# Patient Record
Sex: Male | Born: 1990 | Hispanic: Yes | Marital: Married | Smoking: Current every day smoker
Health system: Southern US, Community
[De-identification: ages and names within clinical notes are randomized; demographics above are authoritative.]

## PROBLEM LIST (undated history)

## (undated) DIAGNOSIS — R51 Headache: Secondary | ICD-10-CM

## (undated) DIAGNOSIS — R42 Dizziness and giddiness: Secondary | ICD-10-CM

## (undated) DIAGNOSIS — Z9989 Dependence on other enabling machines and devices: Secondary | ICD-10-CM

## (undated) DIAGNOSIS — R519 Headache, unspecified: Secondary | ICD-10-CM

## (undated) DIAGNOSIS — G4733 Obstructive sleep apnea (adult) (pediatric): Secondary | ICD-10-CM

## (undated) DIAGNOSIS — K219 Gastro-esophageal reflux disease without esophagitis: Secondary | ICD-10-CM

## (undated) HISTORY — DX: Dependence on other enabling machines and devices: Z99.89

## (undated) HISTORY — DX: Headache, unspecified: R51.9

## (undated) HISTORY — PX: ORIF HUMERUS FRACTURE: SHX2126

## (undated) HISTORY — DX: Gastro-esophageal reflux disease without esophagitis: K21.9

## (undated) HISTORY — PX: GASTRIC BYPASS: SHX52

## (undated) HISTORY — DX: Headache: R51

## (undated) HISTORY — DX: Obstructive sleep apnea (adult) (pediatric): G47.33

## (undated) HISTORY — PX: FRACTURE SURGERY: SHX138

## (undated) NOTE — *Deleted (*Deleted)
[facility] EMERGENCY DEPARTMENT Provider Note   CSN: [id number] Arrival date & time: 07/27/20  1642     History Chief Complaint  Patient presents with  . Abdominal Pain    Arthur Collins is a 16 y.o. male.  HPI     Past Medical History:  Diagnosis Date  . Frequent headaches   . GERD (gastroesophageal reflux disease)   . OSA on CPAP 03/11/2015   Dr Mellody Dance clance.   . Vertigo     Patient Active Problem List   Diagnosis Date Noted  . 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 Chronic fatigue 08/13/2015  . Snoring 08/13/2015  . Complaint of debility and malaise 08/13/2015  . Sleep related headaches 08/13/2015  . 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 Atypical chest pain 06/01/2015  . Obesity 06/01/2015  . Esophageal reflux 06/01/2015  . PND (paroxysmal nocturnal dyspnea) 06/01/2015  . Intermittent palpitations 05/12/2015  . Heart murmur, systolic 05/12/2015  . Intractable migraine with aura with status migrainosus 03/18/2015  . [place] 03/11/2015  . Headache behind the eyes 03/11/2015  . Dysesthesia of multiple sites 03/11/2015  . Benign paroxysmal positional vertigo 03/11/2015  . OSA on CPAP 03/11/2015    Past Surgical History:  Procedure Laterality Date  . FRACTURE SURGERY    . GASTRIC BYPASS    . ORIF HUMERUS FRACTURE         Family History  Problem Relation Age of Onset  . Diabetes Mother   . Hypertension Mother   . Other Father        LIVER ISSUES  . Epilepsy Brother   . Healthy Maternal Grandmother   . Healthy Brother   . Healthy Brother   . Healthy Brother   . Healthy Brother     Social History   Tobacco Use  . Smoking status: Former Smoker    Packs/day: 1.00    Types: Cigarettes  . Smokeless tobacco: Never Used  Vaping Use  . Vaping Use: Every day  . Substances: Nicotine, Flavoring  Substance Use Topics  . Alcohol use: Yes    Alcohol/week: 0.0 standard drinks    Comment: rarely  . Drug use: No    Home  Medications Prior to Admission medications   Medication Sig Start Date End Date Taking? Authorizing Provider  ibuprofen (ADVIL) 800 MG tablet Take 1 tablet (800 mg total) by mouth 3 (three) times daily. 06/25/20   Wieters, Hallie C, PA-C  esomeprazole (NEXIUM) 20 MG packet Take 20 mg by mouth daily before breakfast. 06/01/15 06/25/20  Wallis Bamberg, PA-C    Allergies    Patient has no known allergies.  Review of Systems   Review of Systems  Physical Exam Updated Vital Signs BP (!) 131/94 (BP Location: Right Arm)   Pulse 74   Temp 98.5 F (36.9 C) (Oral)   SpO2 100%   Physical Exam  ED Results / Procedures / Treatments   Labs (all labs ordered are listed, but only abnormal results are displayed) Labs Reviewed  LIPASE, BLOOD - Abnormal; Notable for the following components:      Result Value   Lipase 71 (*)    All other components within normal limits  COMPREHENSIVE METABOLIC PANEL - Abnormal; Notable for the following components:   Total Protein 9.0 (*)    AST 50 (*)    ALT 122 (*)    Anion gap 16 (*)    All other components within normal limits  CBC - Abnormal; Notable for the  following components:   RBC 6.21 (*)    HCT 52.1 (*)    All other components within normal limits  URINALYSIS, ROUTINE W REFLEX MICROSCOPIC    EKG None  Radiology No results found.  Procedures Procedures (including critical care time)  Medications Ordered in ED Medications  lactated ringers bolus 1,000 mL (has no administration in time range)  ondansetron (ZOFRAN) injection 4 mg (has no administration in time range)    ED Course  I have reviewed the triage vital signs and the nursing notes.  Pertinent labs & imaging results that were available during my care of the patient were reviewed by me and considered in my medical decision making (see chart for details).    MDM Rules/Calculators/A&P                          *** Final Clinical Impression(s) / ED Diagnoses Final diagnoses:   None    Rx / DC Orders ED Discharge Orders    None

---
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
## 2010-02-13 ENCOUNTER — Emergency Department (HOSPITAL_COMMUNITY): Admission: EM | Admit: 2010-02-13 | Discharge: 2010-02-13 | Payer: Self-pay | Admitting: Emergency Medicine

## 2011-01-04 LAB — RAPID STREP SCREEN (MED CTR MEBANE ONLY): Streptococcus, Group A Screen (Direct): NEGATIVE

## 2011-07-19 ENCOUNTER — Encounter: Payer: Self-pay | Admitting: *Deleted

## 2011-07-19 ENCOUNTER — Emergency Department (HOSPITAL_COMMUNITY): Payer: Self-pay

## 2011-07-19 ENCOUNTER — Emergency Department (HOSPITAL_COMMUNITY)
Admission: EM | Admit: 2011-07-19 | Discharge: 2011-07-19 | Disposition: A | Discharge status: Home / Self Care | Payer: Self-pay | Attending: Emergency Medicine | Admitting: Emergency Medicine

## 2011-07-19 DIAGNOSIS — R599 Enlarged lymph nodes, unspecified: Secondary | ICD-10-CM | POA: Insufficient documentation

## 2011-07-19 DIAGNOSIS — F17200 Nicotine dependence, unspecified, uncomplicated: Secondary | ICD-10-CM | POA: Insufficient documentation

## 2011-07-19 DIAGNOSIS — R42 Dizziness and giddiness: Secondary | ICD-10-CM | POA: Insufficient documentation

## 2011-07-19 DIAGNOSIS — R51 Headache: Secondary | ICD-10-CM | POA: Insufficient documentation

## 2011-07-19 MED ORDER — MECLIZINE HCL 12.5 MG PO TABS
25.0000 mg | ORAL_TABLET | Freq: Once | ORAL | Status: AC
  Administered 2011-07-19: 25 mg via ORAL
  Filled 2011-07-19: qty 2

## 2011-07-19 MED ORDER — METOCLOPRAMIDE HCL 5 MG/ML IJ SOLN
10.0000 mg | Freq: Once | INTRAMUSCULAR | Status: AC
  Administered 2011-07-19: 10 mg via INTRAVENOUS
  Filled 2011-07-19: qty 2

## 2011-07-19 MED ORDER — PROMETHAZINE HCL 25 MG PO TABS: 25.0000 mg | ORAL_TABLET | Freq: Four times a day (QID) | ORAL | Status: DC | PRN

## 2011-07-19 MED ORDER — SODIUM CHLORIDE 0.9 % IV BOLUS (SEPSIS)
1000.0000 mL | Freq: Once | INTRAVENOUS | Status: AC
  Administered 2011-07-19: 1000 mL via INTRAVENOUS

## 2011-07-19 MED ORDER — MECLIZINE HCL 50 MG PO TABS: ORAL_TABLET | ORAL | Status: DC

## 2011-07-19 MED ORDER — SODIUM CHLORIDE 0.9 % IV SOLN
INTRAVENOUS | Status: DC
  Administered 2011-07-19: 1000 mL via INTRAVENOUS

## 2011-07-19 MED ORDER — DIPHENHYDRAMINE HCL 50 MG/ML IJ SOLN
25.0000 mg | Freq: Once | INTRAMUSCULAR | Status: AC
  Administered 2011-07-19: 25 mg via INTRAVENOUS
  Filled 2011-07-19: qty 1

## 2011-07-19 NOTE — ED Notes (Addendum)
Pt c/o headache to right side of head that began last pm. Pt describes headache as a stabbing pain. To back right side of head. Pt also states he is dizzy and has had blurred vision.

## 2011-07-19 NOTE — ED Provider Notes (Signed)
Scribed for Ward Givens, MD, the patient was seen in room APA12/APA12 . This chart was scribed by Ellie Lunch. This patient's care was started at 3:59 PM.   CSN: [id number] Arrival date & time: 07/19/2011  3:25 PM  Chief Complaint  Patient presents with  . Dizziness  . Headache    (Consider location/radiation/quality/duration/timing/severity/associated sxs/prior treatment) HPI Arthur Collins is a healthy  20 y.o. male who presents to the Emergency Department complaining of headache on right side of head starting last night. Pain began slowly and became progressively worse. Pain waxes and wanes with intensity and is described as throbbing. Pain is currently rated 5/10 in severity and 8/10 at its worst when he gets a sharp jabbing pain in the right side. Pt c/o associated visual disturbance described as blurred vision in both eyes. Also reports episodes of feeling dizzy, like the room is spinning.  Pt denies  N/V, photophobia, fever, or aggravation of pain by loud noise. Also denies runny nose or coughing. Pt reports no Hx of similar Sx. Pt also reports a "knot" on his head located behind his right ear starting 4 months ago. Knot is not painful to palpation, but is occasionally sore.  No ear ache, drainage from his ears. Pt denies scalp infection or finding a tick in his scalp.   No recent change of job. Glasses checked 3 months ago.  No PCP. History reviewed. No pertinent past medical history.  Past Surgical History  Procedure Date  . Orif humerus fracture     History reviewed. No pertinent family history.  History  Substance Use Topics  . Smoking status: Passive Smoker  . Smokeless tobacco: Not on file  . Alcohol Use: Yes     occasionally  Works at [employer].  Review of Systems 10 Systems reviewed and are negative for acute change except as noted in the HPI.  Allergies  Review of patient's allergies indicates no known allergies.  Home Medications  No current outpatient  prescriptions on file.  BP 144/76  Pulse 91  Temp(Src) 98.8 F (37.1 C) (Oral)  Resp 18  Ht 5\' 10"  (1.778 m)  Wt 225 lb (102.059 kg)  BMI 32.28 kg/m2  SpO2 100%  Physical Exam  Vitals reviewed. Constitutional: He appears well-developed and well-nourished.  HENT:  Head: Normocephalic and atraumatic.  Right Ear: Hearing, tympanic membrane, external ear and ear canal normal.  Left Ear: Hearing, tympanic membrane, external ear and ear canal normal.  Nose: Nose normal.  Mouth/Throat: Oropharynx is clear and moist.  Eyes: Conjunctivae and EOM are normal. Pupils are equal, round, and reactive to light.  Neck: Normal range of motion and full passive range of motion without pain. Neck supple.  Cardiovascular: Normal rate, regular rhythm and normal heart sounds.   Pulmonary/Chest: Effort normal and breath sounds normal.  Musculoskeletal:       Pt has no acute abnormality.   Lymphadenopathy:       Head (right side): Posterior auricular adenopathy present. No submental, no submandibular, no tonsillar, no preauricular and no occipital adenopathy present.       Head (left side): No submental, no submandibular, no tonsillar, no preauricular, no posterior auricular and no occipital adenopathy present.       Pt has a 1 cm soft mobile cystic/lymph node behind his right ear, skin is normal without redness, warmth or drainage. Nontender to palpation. Scalp inspected, no lesions seen.   Neurological: He is alert. He has normal strength. No cranial nerve deficit.  Skin: Skin is warm, dry and intact.  Psychiatric: His speech is normal and behavior is normal. Judgment and thought content normal. His affect is blunt.   Procedures (including critical care time)  OTHER DATA REVIEWED: Nursing notes, vital signs, and past medical records reviewed.  DIAGNOSTIC STUDIES: Oxygen Saturation is 100% on room air, normal by my interpretation.    Ct Head Wo Contrast  07/19/2011  *RADIOLOGY REPORT*  Clinical  Data:  Dizziness, blurred vision and headache.  CT HEAD WITHOUT CONTRAST  Technique:  Contiguous axial images were obtained from the base of the skull through the vertex without contrast  Comparison:  None.  Findings:  The brain has a normal appearance without evidence for hemorrhage, acute infarction, hydrocephalus, or mass lesion.  There is no extra axial fluid collection.  The skull and paranasal sinuses are normal.  IMPRESSION: Normal CT of the head without contrast.  Original Report Authenticated By: Reola Calkins, M.D.     16:50 Visual Acuity Visual Acuity - Bilateral Near: 20 (with glasses) ; Bilateral Distance: 20 ; R Near: 20 ; R Distance: 25 ; L Near: 20 ; L Distance: [address] [place], [place] 16:10:96 Visual acuity screening Completed Visual acuity screening Crystal [place], RN  LABS / RADIOLOGY:  Ct Head Wo Contrast  07/19/2011  *RADIOLOGY REPORT*  Clinical Data:  Dizziness, blurred vision and headache.  CT HEAD WITHOUT CONTRAST  Technique:  Contiguous axial images were obtained from the base of the skull through the vertex without contrast  Comparison:  None.  Findings:  The brain has a normal appearance without evidence for hemorrhage, acute infarction, hydrocephalus, or mass lesion.  There is no extra axial fluid collection.  The skull and paranasal sinuses are normal.  IMPRESSION: Normal CT of the head without contrast.  Original Report Authenticated By: Reola Calkins, M.D.    ED COURSE / COORDINATION OF CARE: 16:05 EDP discussed treatment plan with PT including Head CT, visual acuity test and treatment of pain. ED ordered the following medication: Medications  0.9 %  sodium chloride infusion   sodium chloride 0.9 % bolus 1,000 mL (1000 mL Intravenous Given 07/19/11 1719)  metoCLOPramide (REGLAN) 5 MG/ML injection 10 mg (10 mg Intravenous Given 07/19/11 1720)  diphenhydrAMINE (BENADRYL) injection 25 mg (25 mg Intravenous Given 07/19/11 1722)  meclizine  (ANTIVERT) tablet 25 mg (25 mg Oral Given 07/19/11 1719)  20:10 Pt recheck Pt states his headache is much better, still has some dizziness. EDP discussed follow up with ENT for cyst behind ear and follow up for dizziness. Plan to discharge with medication meclizine 50mg  and promethazine 25mg .   MDM:  I personally performed the services described in this documentation, which was scribed in my presence. The recorded information has been reviewed and considered. Devoria Albe, MD, Armando Gang          Ward Givens, MD 07/20/11 [phone]

## 2011-07-28 ENCOUNTER — Ambulatory Visit (INDEPENDENT_AMBULATORY_CARE_PROVIDER_SITE_OTHER): Payer: Self-pay | Admitting: Otolaryngology

## 2011-07-28 DIAGNOSIS — H814 Vertigo of central origin: Secondary | ICD-10-CM

## 2011-07-28 DIAGNOSIS — R42 Dizziness and giddiness: Secondary | ICD-10-CM

## 2011-07-28 DIAGNOSIS — D487 Neoplasm of uncertain behavior of other specified sites: Secondary | ICD-10-CM

## 2011-09-15 ENCOUNTER — Ambulatory Visit (INDEPENDENT_AMBULATORY_CARE_PROVIDER_SITE_OTHER): Payer: Self-pay | Admitting: Otolaryngology

## 2014-04-26 ENCOUNTER — Emergency Department (HOSPITAL_COMMUNITY)
Admission: EM | Admit: 2014-04-26 | Discharge: 2014-04-26 | Disposition: A | Discharge status: Home / Self Care | Payer: Self-pay | Attending: Emergency Medicine | Admitting: Emergency Medicine

## 2014-04-26 ENCOUNTER — Emergency Department (HOSPITAL_COMMUNITY): Payer: Self-pay

## 2014-04-26 ENCOUNTER — Encounter (HOSPITAL_COMMUNITY): Payer: Self-pay | Admitting: Emergency Medicine

## 2014-04-26 DIAGNOSIS — Y9389 Activity, other specified: Secondary | ICD-10-CM | POA: Insufficient documentation

## 2014-04-26 DIAGNOSIS — Y92410 Unspecified street and highway as the place of occurrence of the external cause: Secondary | ICD-10-CM | POA: Insufficient documentation

## 2014-04-26 DIAGNOSIS — IMO0002 Reserved for concepts with insufficient information to code with codable children: Secondary | ICD-10-CM | POA: Insufficient documentation

## 2014-04-26 DIAGNOSIS — F17200 Nicotine dependence, unspecified, uncomplicated: Secondary | ICD-10-CM | POA: Insufficient documentation

## 2014-04-26 DIAGNOSIS — S66912A Strain of unspecified muscle, fascia and tendon at wrist and hand level, left hand, initial encounter: Secondary | ICD-10-CM

## 2014-04-26 DIAGNOSIS — S63509A Unspecified sprain of unspecified wrist, initial encounter: Secondary | ICD-10-CM | POA: Insufficient documentation

## 2014-04-26 MED ORDER — IBUPROFEN 800 MG PO TABS
800.0000 mg | ORAL_TABLET | Freq: Once | ORAL | Status: AC
  Administered 2014-04-26: 800 mg via ORAL
  Filled 2014-04-26: qty 1

## 2014-04-26 MED ORDER — CYCLOBENZAPRINE HCL 5 MG PO TABS: 5.0000 mg | ORAL_TABLET | Freq: Three times a day (TID) | ORAL | Status: DC | PRN

## 2014-04-26 MED ORDER — IBUPROFEN 600 MG PO TABS: 600.0000 mg | ORAL_TABLET | Freq: Four times a day (QID) | ORAL | Status: DC | PRN
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
## 2014-04-26 NOTE — ED Notes (Signed)
Patient's left wrist dressed with ace bandage.
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
## 2014-04-27 NOTE — ED Provider Notes (Signed)
Medical screening examination/treatment/procedure(s) were performed by non-physician practitioner and as supervising physician I was immediately available for consultation/collaboration.   EKG Interpretation None       Faelyn Sigler, MD 04/27/14 0726 

## 2014-07-31 ENCOUNTER — Emergency Department (HOSPITAL_COMMUNITY): Payer: Self-pay

## 2014-07-31 ENCOUNTER — Encounter (HOSPITAL_COMMUNITY): Payer: Self-pay | Admitting: Emergency Medicine

## 2014-07-31 ENCOUNTER — Emergency Department (HOSPITAL_COMMUNITY)
Admission: EM | Admit: 2014-07-31 | Discharge: 2014-07-31 | Disposition: A | Discharge status: Home / Self Care | Payer: Self-pay | Attending: Emergency Medicine | Admitting: Emergency Medicine

## 2014-07-31 DIAGNOSIS — R109 Unspecified abdominal pain: Secondary | ICD-10-CM

## 2014-07-31 DIAGNOSIS — Z720 Tobacco use: Secondary | ICD-10-CM | POA: Insufficient documentation

## 2014-07-31 DIAGNOSIS — R112 Nausea with vomiting, unspecified: Secondary | ICD-10-CM | POA: Insufficient documentation

## 2014-07-31 DIAGNOSIS — R1031 Right lower quadrant pain: Secondary | ICD-10-CM | POA: Insufficient documentation

## 2014-07-31 DIAGNOSIS — R630 Anorexia: Secondary | ICD-10-CM | POA: Insufficient documentation

## 2014-07-31 LAB — CBC WITH DIFFERENTIAL/PLATELET
BASOS PCT: 1 % (ref 0–1)
Basophils Absolute: 0.1 10*3/uL (ref 0.0–0.1)
EOS ABS: 0.8 10*3/uL — AB (ref 0.0–0.7)
Eosinophils Relative: 7 % — ABNORMAL HIGH (ref 0–5)
HCT: 46.3 % (ref 39.0–52.0)
Hemoglobin: 15.7 g/dL (ref 13.0–17.0)
LYMPHS ABS: 2.9 10*3/uL (ref 0.7–4.0)
Lymphocytes Relative: 26 % (ref 12–46)
MCH: 29.1 pg (ref 26.0–34.0)
MCHC: 33.9 g/dL (ref 30.0–36.0)
MCV: 85.7 fL (ref 78.0–100.0)
Monocytes Absolute: 0.7 10*3/uL (ref 0.1–1.0)
Monocytes Relative: 6 % (ref 3–12)
NEUTROS ABS: 6.9 10*3/uL (ref 1.7–7.7)
Neutrophils Relative %: 60 % (ref 43–77)
PLATELETS: 260 10*3/uL (ref 150–400)
RBC: 5.4 MIL/uL (ref 4.22–5.81)
RDW: 12.7 % (ref 11.5–15.5)
WBC: 11.4 10*3/uL — ABNORMAL HIGH (ref 4.0–10.5)

## 2014-07-31 LAB — BASIC METABOLIC PANEL
Anion gap: 12 (ref 5–15)
BUN: 12 mg/dL (ref 6–23)
CO2: 28 meq/L (ref 19–32)
Calcium: 10 mg/dL (ref 8.4–10.5)
Chloride: 101 mEq/L (ref 96–112)
Creatinine, Ser: 0.74 mg/dL (ref 0.50–1.35)
GFR calc Af Amer: >90 mL/min (ref >90)
GLUCOSE: 97 mg/dL (ref 70–99)
Potassium: 4.1 mEq/L (ref 3.7–5.3)
SODIUM: 141 meq/L (ref 137–147)

## 2014-07-31 LAB — LIPASE, BLOOD: LIPASE: 20 U/L (ref 11–59)

## 2014-07-31 LAB — HEPATIC FUNCTION PANEL
ALBUMIN: 4.5 g/dL (ref 3.5–5.2)
ALK PHOS: 130 U/L — AB (ref 39–117)
ALT: 38 U/L (ref 0–53)
AST: 21 U/L (ref 0–37)
Total Bilirubin: 0.2 mg/dL — ABNORMAL LOW (ref 0.3–1.2)
Total Protein: 8.5 g/dL — ABNORMAL HIGH (ref 6.0–8.3)

## 2014-07-31 LAB — URINALYSIS, ROUTINE W REFLEX MICROSCOPIC
BILIRUBIN URINE: NEGATIVE
GLUCOSE, UA: NEGATIVE mg/dL
HGB URINE DIPSTICK: NEGATIVE
KETONES UR: NEGATIVE mg/dL
Leukocytes, UA: NEGATIVE
Nitrite: NEGATIVE
Protein, ur: NEGATIVE mg/dL
SPECIFIC GRAVITY, URINE: 1.025 (ref 1.005–1.030)
Urobilinogen, UA: 0.2 mg/dL (ref 0.0–1.0)
pH: 5.5 (ref 5.0–8.0)

## 2014-07-31 MED ORDER — SODIUM CHLORIDE 0.9 % IV BOLUS (SEPSIS)
1000.0000 mL | Freq: Once | INTRAVENOUS | Status: AC
  Administered 2014-07-31: 1000 mL via INTRAVENOUS

## 2014-07-31 MED ORDER — ONDANSETRON HCL 4 MG/2ML IJ SOLN
4.0000 mg | Freq: Once | INTRAMUSCULAR | Status: AC
  Administered 2014-07-31: 4 mg via INTRAVENOUS
  Filled 2014-07-31: qty 2

## 2014-07-31 MED ORDER — IOHEXOL 300 MG/ML  SOLN
100.0000 mL | Freq: Once | INTRAMUSCULAR | Status: AC | PRN
  Administered 2014-07-31: 100 mL via INTRAVENOUS

## 2014-07-31 NOTE — ED Provider Notes (Signed)
CSN: [id number]     Arrival date & time 07/31/14  1706 History   First MD Initiated Contact with Patient 07/31/14 1942     Chief Complaint  Patient presents with  . Flank Pain     (Consider location/radiation/quality/duration/timing/severity/associated sxs/prior Treatment) HPI Comments: One week of worsening right-sided abdominal pain with nausea. No vomiting. 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 Endorses anorexia. Denies any fever, chills, vomiting. No change in bowel habits. No testicular pain. No dysuria or hematuria. Appendix still intact.  The history is provided by the patient.    History reviewed. No pertinent past medical history. Past Surgical History  Procedure Laterality Date  . Orif humerus fracture    . Fracture surgery     History reviewed. No pertinent family history. History  Substance Use Topics  . Smoking status: Current Every Day Smoker -- 0.50 packs/day    Types: Cigarettes  . Smokeless tobacco: Not on file  . Alcohol Use: Yes     Comment: occasionally    Review of Systems  Constitutional: Positive for activity change and appetite change. Negative for fever.  HENT: Negative for congestion and rhinorrhea.   Eyes: Negative for visual disturbance.  Respiratory: Negative for cough, chest tightness and shortness of breath.   Cardiovascular: Negative for chest pain.  Gastrointestinal: Positive for nausea, vomiting and abdominal pain.  Genitourinary: Positive for flank pain. Negative for dysuria, hematuria and testicular pain.  Musculoskeletal: Negative for arthralgias, back pain, myalgias, neck pain and neck stiffness.  Skin: Negative for rash.  Neurological: Negative for dizziness and headaches.  A complete 10 system review of systems was obtained and all systems are negative except as noted in the HPI and PMH.      Allergies  Review of patient's allergies indicates no known allergies.  Home Medications    Prior to Admission medications   Not on File   BP 135/90  Pulse 71  Temp(Src) 99 F (37.2 C) (Oral)  Resp 19  Ht 5\' 9"  (1.753 m)  Wt 235 lb (106.595 kg)  BMI 34.69 kg/m2  SpO2 100% Physical Exam  Nursing note and vitals reviewed. Constitutional: He is oriented to person, place, and time. He appears well-developed and well-nourished. No distress.  HENT:  Head: Normocephalic and atraumatic.  Mouth/Throat: Oropharynx is clear and moist. No oropharyngeal exudate.  Eyes: Conjunctivae and EOM are normal. Pupils are equal, round, and reactive to light.  Neck: Normal range of motion. Neck supple.  No meningismus.  Cardiovascular: Normal rate, regular rhythm, normal heart sounds and intact distal pulses.   No murmur heard. Pulmonary/Chest: Effort normal and breath sounds normal. No respiratory distress.  Abdominal: Soft. There is tenderness. There is guarding. There is no rebound.  TTP RLQ with guarding.  No rebound  Genitourinary:  No testicular pain  Musculoskeletal: Normal range of motion. He exhibits no edema and no tenderness.  Neurological: He is alert and oriented to person, place, and time. No cranial nerve deficit. He exhibits normal muscle tone. Coordination normal.  No ataxia on finger to nose bilaterally. No pronator drift. 5/5 strength throughout. CN 2-12 intact. Negative Romberg. Equal grip strength. Sensation intact. Gait is normal.   Skin: Skin is warm.  Psychiatric: He has a normal mood and affect. His behavior is normal.    ED Course  Procedures (including critical care time) Labs Review Labs Reviewed  CBC WITH DIFFERENTIAL - Abnormal; Notable for the following:    WBC 11.4 (*)  Eosinophils Relative 7 (*)    Eosinophils Absolute 0.8 (*)    All other components within normal limits  HEPATIC FUNCTION PANEL - Abnormal; Notable for the following:    Total Protein 8.5 (*)    Alkaline Phosphatase 130 (*)    Total Bilirubin 0.2 (*)    All other components  within normal limits  BASIC METABOLIC PANEL  URINALYSIS, ROUTINE W REFLEX MICROSCOPIC  LIPASE, BLOOD    Imaging Review Ct Abdomen Pelvis W Contrast  07/31/2014   CLINICAL DATA:  Right mid and lower quadrant abdominal pain for 1 week. Nausea. Initial encounter.  EXAM: CT ABDOMEN AND PELVIS WITH CONTRAST  TECHNIQUE: Multidetector CT imaging of the abdomen and pelvis was performed using the standard protocol following bolus administration of intravenous contrast.  CONTRAST:  139mL OMNIPAQUE IOHEXOL 300 MG/ML  SOLN  COMPARISON:  None.  FINDINGS: Normal hepatic contour. No discrete hepatic lesions. Normal appearance of the gallbladder. No right a gallstones. No intra or extrahepatic biliary duct dilatation. No ascites.  There is symmetric enhancement of the bilateral kidneys. No definite renal stones in this postcontrast examination. No discrete renal lesions. No urinary destruction or perinephric stranding. Normal appearance of the urinary bladder given degree distention.  Normal appearance of the bilateral adrenal glands, pancreas and spleen.  Ingested enteric contrast extends to the level of the sigmoid colon. Moderate colonic stool burden without evidence of enteric obstruction. The bowel is normal in course and caliber without wall thickening. Normal appearance of the appendix. No pneumoperitoneum, pneumatosis or portal venous gas.  Normal caliber the abdominal aorta. The major branch vessels of the abdominal aorta appear widely patent on this non CTA examination. No retroperitoneal, mesenteric, pelvic or inguinal lymphadenopathy.  Normal appearance of the pelvic organs. No free fluid in the pelvic cul-de-sac.  Limited visualization of lower thorax is negative for focal airspace opacity or pleural effusion.  Normal heart size.  No pericardial effusion.  No acute or aggressive osseous abnormalities.  Regional soft tissues appear normal.  IMPRESSION: No explanation for patient's right mid and lower abdominal  pain. Specifically, no evidence of enteric or urinary obstruction. Normal appearance of the appendix.   Electronically Signed   By: Sandi Mariscal M.D.   On: 07/31/2014 21:40     EKG Interpretation None      MDM   Final diagnoses:  Abdominal pain, unspecified abdominal location   right lower quadrant pain with nausea. No testicular pain.  Rule out appendicitis UA negative. Leukocytosis of 11.  CT scan shows normal appendix. No evidence of obstruction or any other acute pathology.  Patient appears well. Pain is well controlled. He is tolerating by mouth. He may have some component of constipation. Followup with PCP. Return to the ED with worsening symptoms.    Ezequiel Essex, MD 07/31/14 2201

## 2014-07-31 NOTE — ED Notes (Signed)
MD at bedside. 

## 2014-07-31 NOTE — Discharge Instructions (Signed)
Abdominal Pain Your appendix is normal.  Follow up with your doctor. Return to the ED if you develop new or worsening symptoms. Many things can cause abdominal pain. Usually, abdominal pain is not caused by a disease and will improve without treatment. It can often be observed and treated at home. Your health care provider will do a physical exam and possibly order blood tests and X-rays to help determine the seriousness of your pain. However, in many cases, more time must pass before a clear cause of the pain can be found. Before that point, your health care provider may not know if you need more testing or further treatment. HOME CARE INSTRUCTIONS  Monitor your abdominal pain for any changes. The following actions may help to alleviate any discomfort you are experiencing:  Only take over-the-counter or prescription medicines as directed by your health care provider.  Do not take laxatives unless directed to do so by your health care provider.  Try a clear liquid diet (broth, tea, or water) as directed by your health care provider. Slowly move to a bland diet as tolerated. SEEK MEDICAL CARE IF:  You have unexplained abdominal pain.  You have abdominal pain associated with nausea or diarrhea.  You have pain when you urinate or have a bowel movement.  You experience abdominal pain that wakes you in the night.  You have abdominal pain that is worsened or improved by eating food.  You have abdominal pain that is worsened with eating fatty foods.  You have a fever. SEEK IMMEDIATE MEDICAL CARE IF:   Your pain does not go away within 2 hours.  You keep throwing up (vomiting).  Your pain is felt only in portions of the abdomen, such as the right side or the left lower portion of the abdomen.  You pass bloody or black tarry stools. MAKE SURE YOU:  Understand these instructions.   Will watch your condition.   Will get help right away if you are not doing well or get worse.   Document Released: 07/13/2005 Document Revised: 10/08/2013 Document Reviewed: 06/12/2013 [facility] Patient Information 2015 [place], [place]. This information is not intended to replace advice given to you by your health care provider. Make sure you discuss any questions you have with your health care provider.

## 2014-07-31 NOTE — ED Notes (Signed)
Pain rt mid abd for 1 week, worsening. Nausea, no vomiting

## 2015-02-27 ENCOUNTER — Emergency Department (HOSPITAL_COMMUNITY)
Admission: EM | Admit: 2015-02-27 | Discharge: 2015-02-27 | Disposition: A | Discharge status: Home / Self Care | Payer: 59 | Attending: Emergency Medicine | Admitting: Emergency Medicine

## 2015-02-27 ENCOUNTER — Encounter (HOSPITAL_COMMUNITY): Payer: Self-pay | Admitting: *Deleted

## 2015-02-27 ENCOUNTER — Emergency Department (HOSPITAL_COMMUNITY): Payer: 59

## 2015-02-27 DIAGNOSIS — R200 Anesthesia of skin: Secondary | ICD-10-CM | POA: Insufficient documentation

## 2015-02-27 DIAGNOSIS — H539 Unspecified visual disturbance: Secondary | ICD-10-CM | POA: Insufficient documentation

## 2015-02-27 DIAGNOSIS — Z720 Tobacco use: Secondary | ICD-10-CM | POA: Diagnosis not present

## 2015-02-27 DIAGNOSIS — R51 Headache: Secondary | ICD-10-CM | POA: Diagnosis not present

## 2015-02-27 DIAGNOSIS — R519 Headache, unspecified: Secondary | ICD-10-CM

## 2015-02-27 LAB — COMPREHENSIVE METABOLIC PANEL
ALBUMIN: 4.2 g/dL (ref 3.5–5.0)
ALK PHOS: 107 U/L (ref 38–126)
ALT: 39 U/L (ref 17–63)
ANION GAP: 8 (ref 5–15)
AST: 25 U/L (ref 15–41)
BUN: 13 mg/dL (ref 6–20)
CO2: 27 mmol/L (ref 22–32)
CREATININE: 0.73 mg/dL (ref 0.61–1.24)
Calcium: 9.2 mg/dL (ref 8.9–10.3)
Chloride: 103 mmol/L (ref 101–111)
GFR calc Af Amer: >60 mL/min (ref >60)
GFR calc non Af Amer: >60 mL/min (ref >60)
GLUCOSE: 127 mg/dL — AB (ref 65–99)
POTASSIUM: 3.5 mmol/L (ref 3.5–5.1)
SODIUM: 138 mmol/L (ref 135–145)
TOTAL PROTEIN: 7.8 g/dL (ref 6.5–8.1)
Total Bilirubin: 0.4 mg/dL (ref 0.3–1.2)

## 2015-02-27 LAB — CBC WITH DIFFERENTIAL/PLATELET
BASOS PCT: 0 % (ref 0–1)
Basophils Absolute: 0 10*3/uL (ref 0.0–0.1)
EOS ABS: 0.2 10*3/uL (ref 0.0–0.7)
Eosinophils Relative: 3 % (ref 0–5)
HCT: 42.9 % (ref 39.0–52.0)
Hemoglobin: 14.2 g/dL (ref 13.0–17.0)
Lymphocytes Relative: 34 % (ref 12–46)
Lymphs Abs: 2.8 10*3/uL (ref 0.7–4.0)
MCH: 28.6 pg (ref 26.0–34.0)
MCHC: 33.1 g/dL (ref 30.0–36.0)
MCV: 86.5 fL (ref 78.0–100.0)
MONO ABS: 0.6 10*3/uL (ref 0.1–1.0)
MONOS PCT: 7 % (ref 3–12)
NEUTROS ABS: 4.7 10*3/uL (ref 1.7–7.7)
Neutrophils Relative %: 56 % (ref 43–77)
Platelets: 257 10*3/uL (ref 150–400)
RBC: 4.96 MIL/uL (ref 4.22–5.81)
RDW: 12.4 % (ref 11.5–15.5)
WBC: 8.3 10*3/uL (ref 4.0–10.5)

## 2015-02-27 MED ORDER — HYDROCODONE-ACETAMINOPHEN 5-325 MG PO TABS: 1.0000 | ORAL_TABLET | Freq: Four times a day (QID) | ORAL | Status: DC | PRN

## 2015-02-27 MED ORDER — PROMETHAZINE HCL 12.5 MG PO TABS
25.0000 mg | ORAL_TABLET | Freq: Once | ORAL | Status: AC
  Administered 2015-02-27: 25 mg via ORAL
  Filled 2015-02-27: qty 2

## 2015-02-27 MED ORDER — KETOROLAC TROMETHAMINE 60 MG/2ML IM SOLN
60.0000 mg | Freq: Once | INTRAMUSCULAR | Status: AC
  Administered 2015-02-27: 60 mg via INTRAMUSCULAR
  Filled 2015-02-27: qty 2

## 2015-02-27 MED ORDER — PROMETHAZINE HCL 25 MG PO TABS: 25.0000 mg | ORAL_TABLET | Freq: Four times a day (QID) | ORAL | Status: DC | PRN

## 2015-02-27 NOTE — ED Notes (Signed)
Headache, dizzy, lt arm numbness onset 6 pm.

## 2015-02-27 NOTE — ED Notes (Signed)
Pt states that he has had a headache since yesterday afternoon.  States that around 6pm last night his right arm started getting tingly and numb feeling.  States feels like it is asleep.  Denies any other complaints.

## 2015-02-27 NOTE — ED Provider Notes (Addendum)
CSN: [id number]     Arrival date & time 02/27/15  1105 History   First MD Initiated Contact with Patient 02/27/15 1122     Chief Complaint  Patient presents with  . Numbness   The history is provided by the patient. No language interpreter was used.   This chart was scribed for [name], MD by Thea Alken, ED Scribe. This patient was seen in room APA02/APA02 and the patient's care was started at 2:08 PM.  HPI Comments:  RAMAL ECKHARDT is a 24 y.o. male who present to the Emergency Department complaining of a waxing and waning, parietal, temporal HA that began yesterday at 6pm. Pt states that while at work he developed sharp, parietal,temporal HA. He rates current HA 6/10 but reports at its worse HA is a 10/10.  He reports todays about 3 hours go while working he began to have numbness and tingling in right arm.  Pt has had visual changes that he describes as spotting. Pt denies nausea, emesis, fever, chills, cough, sore throat, CP, SOB, abdominal pain, nausea, emesis, diarrhea, rash, leg swelling, bleeding eaisly, dyusira, back pain, dizziness.   History reviewed. No pertinent past medical history. Past Surgical History  Procedure Laterality Date  . Orif humerus fracture    . Fracture surgery     History reviewed. No pertinent family history. History  Substance Use Topics  . Smoking status: Current Every Day Smoker -- 0.50 packs/day    Types: Cigarettes  . Smokeless tobacco: Not on file  . Alcohol Use: Yes     Comment: occasionally    Review of Systems  Constitutional: Negative for fever and chills.  HENT: Negative for sore throat.   Eyes: Positive for visual disturbance.  Respiratory: Negative for cough and shortness of breath.   Cardiovascular: Negative for chest pain and leg swelling.  Gastrointestinal: Negative for nausea, vomiting, abdominal pain and diarrhea.  Genitourinary: Negative for dysuria.  Musculoskeletal: Negative for back pain.  Skin: Negative for rash.   Neurological: Positive for numbness and headaches. Negative for dizziness.  Hematological: Does not bruise/bleed easily.    Allergies  Review of patient's allergies indicates no known allergies.  Home Medications   Prior to Admission medications   Medication Sig Start Date End Date Taking? Authorizing Provider  HYDROcodone-acetaminophen (NORCO/VICODIN) 5-325 MG per tablet Take 1-2 tablets by mouth every 6 (six) hours as needed. 02/27/15   [name], MD  promethazine (PHENERGAN) 25 MG tablet Take 1 tablet (25 mg total) by mouth every 6 (six) hours as needed for nausea. 07/19/11 02/27/16  Rolland Porter, MD  promethazine (PHENERGAN) 25 MG tablet Take 1 tablet (25 mg total) by mouth every 6 (six) hours as needed. 02/27/15   [name], MD   BP 116/65 mmHg  Pulse 69  Temp(Src) 98.3 F (36.8 C) (Oral)  Resp 18  Ht 5\' 9"  (1.753 m)  Wt 246 lb (111.585 kg)  BMI 36.31 kg/m2  SpO2 100% Physical Exam  Constitutional: He is oriented to person, place, and time. He appears well-developed and well-nourished. No distress.  HENT:  Head: Normocephalic and atraumatic.  Mouth/Throat: Oropharynx is clear and moist.  Eyes: Conjunctivae and EOM are normal. Pupils are equal, round, and reactive to light.  Neck: Neck supple.  Cardiovascular: Normal rate and regular rhythm.   Pulmonary/Chest: Effort normal.  Room air sat 99%  Abdominal: Soft. There is no tenderness.  Musculoskeletal: Normal range of motion. He exhibits no edema.  Neurological: He is alert and oriented  to person, place, and time.  Skin: Skin is warm and dry.  Psychiatric: He has a normal mood and affect. His behavior is normal.  Nursing note and vitals reviewed.   ED Course  Procedures (including critical care time) DIAGNOSTIC STUDIES: Oxygen Saturation is 100% on RA, normal by my interpretation.    COORDINATION OF CARE: 2:08 PM- Pt advised of plan for treatment and pt agrees.  Labs Review Labs Reviewed  COMPREHENSIVE  METABOLIC PANEL - Abnormal; Notable for the following:    Glucose, Bld 127 (*)    All other components within normal limits  CBC WITH DIFFERENTIAL/PLATELET   Results for orders placed or performed during the hospital encounter of 02/27/15  CBC with Differential/Platelet  Result Value Ref Range   WBC 8.3 4.0 - 10.5 K/uL   RBC 4.96 4.22 - 5.81 MIL/uL   Hemoglobin 14.2 13.0 - 17.0 g/dL   HCT 42.9 39.0 - 52.0 %   MCV 86.5 78.0 - 100.0 fL   MCH 28.6 26.0 - 34.0 pg   MCHC 33.1 30.0 - 36.0 g/dL   RDW 12.4 11.5 - 15.5 %   Platelets 257 150 - 400 K/uL   Neutrophils Relative % 56 43 - 77 %   Neutro Abs 4.7 1.7 - 7.7 K/uL   Lymphocytes Relative 34 12 - 46 %   Lymphs Abs 2.8 0.7 - 4.0 K/uL   Monocytes Relative 7 3 - 12 %   Monocytes Absolute 0.6 0.1 - 1.0 K/uL   Eosinophils Relative 3 0 - 5 %   Eosinophils Absolute 0.2 0.0 - 0.7 K/uL   Basophils Relative 0 0 - 1 %   Basophils Absolute 0.0 0.0 - 0.1 K/uL  Comprehensive metabolic panel  Result Value Ref Range   Sodium 138 135 - 145 mmol/L   Potassium 3.5 3.5 - 5.1 mmol/L   Chloride 103 101 - 111 mmol/L   CO2 27 22 - 32 mmol/L   Glucose, Bld 127 (H) 65 - 99 mg/dL   BUN 13 6 - 20 mg/dL   Creatinine, Ser 0.73 0.61 - 1.24 mg/dL   Calcium 9.2 8.9 - 10.3 mg/dL   Total Protein 7.8 6.5 - 8.1 g/dL   Albumin 4.2 3.5 - 5.0 g/dL   AST 25 15 - 41 U/L   ALT 39 17 - 63 U/L   Alkaline Phosphatase 107 38 - 126 U/L   Total Bilirubin 0.4 0.3 - 1.2 mg/dL   GFR calc non Af Amer >60 >60 mL/min   GFR calc Af Amer >60 >60 mL/min   Anion gap 8 5 - 15     Imaging Review Ct Head Wo Contrast  02/27/2015   CLINICAL DATA:  Right-sided headaches since yesterday.  No injury.  EXAM: CT HEAD WITHOUT CONTRAST  TECHNIQUE: Contiguous axial images were obtained from the base of the skull through the vertex without intravenous contrast.  COMPARISON:  07/19/2011  FINDINGS: Ventricles, cisterns and other CSF spaces are within normal. There is no mass, mass effect, shift  of midline structures or acute hemorrhage. There is no evidence of acute infarction. Bones soft tissues are within normal.  IMPRESSION: Normal head CT.   Electronically Signed   By: Marin Olp M.D.   On: 02/27/2015 13:36     EKG Interpretation   Date/Time:  Friday Feb 27 2015 11:22:36 EDT Ventricular Rate:  92 PR Interval:  141 QRS Duration: 96 QT Interval:  337 QTC Calculation: 417 R Axis:   76 Text Interpretation:  Sinus rhythm ST elev, probable normal early repol  pattern No previous ECGs available Confirmed by Shahram Alexopoulos  MD, Makendra Vigeant  [phone]) on 02/27/2015 11:32:55 AM      MDM   Final diagnoses:  Headache    Headache may be a migraine-like but no history of migraines. Head CT negative. Chest numbness to the right arm no other neuro focal deficits. Patient treated here with Phenergan without significant help. Patient will be treated with Toradol and then sent home with Phenergan and hydrocodone. If symptoms persist MRI of brain would be appropriate. No acute abnormalities. No leukocytosis no significant electrolyte abnormalities. Patient nontoxic no acute distress.  I personally performed the services described in this documentation, which was scribed in my presence. The recorded information has been reviewed and is accurate.     [name], MD 02/27/15 1409  Addendum: Patient did have some unusual chest sensation but no true chest pain EKG was done out at triage had no acute findings other than early repolarization.  [name], MD 02/27/15 1415

## 2015-02-27 NOTE — Discharge Instructions (Signed)
Go home and rest. Take the hydrocodone as needed take the Phenergan as needed. If headache does not resolve then MRI would be indicated. Return for any new or worse symptoms. Head CT here today was negative. Labs without significant abnormalities.

## 2015-03-02 ENCOUNTER — Ambulatory Visit (INDEPENDENT_AMBULATORY_CARE_PROVIDER_SITE_OTHER): Payer: 59 | Admitting: Family Medicine

## 2015-03-02 VITALS — BP 130/68 | HR 72 | Temp 98.8°F | Resp 16 | Ht 68.0 in | Wt 249.6 lb

## 2015-03-02 DIAGNOSIS — R110 Nausea: Secondary | ICD-10-CM | POA: Diagnosis not present

## 2015-03-02 DIAGNOSIS — R42 Dizziness and giddiness: Secondary | ICD-10-CM | POA: Diagnosis not present

## 2015-03-02 DIAGNOSIS — G4489 Other headache syndrome: Secondary | ICD-10-CM

## 2015-03-02 DIAGNOSIS — R202 Paresthesia of skin: Secondary | ICD-10-CM

## 2015-03-02 LAB — POCT SEDIMENTATION RATE: POCT SED RATE: 20 mm/hr (ref 0–22)

## 2015-03-02 LAB — POCT CBC
Granulocyte percent: 69.6 %G (ref 37–80)
HCT, POC: 47 % (ref 43.5–53.7)
Hemoglobin: 15 g/dL (ref 14.1–18.1)
Lymph, poc: 2.3 (ref 0.6–3.4)
MCH, POC: 27.5 pg (ref 27–31.2)
MCHC: 31.8 g/dL (ref 31.8–35.4)
MCV: 86.5 fL (ref 80–97)
MID (cbc): 0.4 (ref 0–0.9)
MPV: 8.4 fL (ref 0–99.8)
POC Granulocyte: 6.1 (ref 2–6.9)
POC LYMPH PERCENT: 26.2 % (ref 10–50)
POC MID %: 4.2 %M (ref 0–12)
Platelet Count, POC: 239 10*3/uL (ref 142–424)
RBC: 5.44 M/uL (ref 4.69–6.13)
RDW, POC: 13.9 %
WBC: 8.8 10*3/uL (ref 4.6–10.2)

## 2015-03-02 LAB — POCT GLYCOSYLATED HEMOGLOBIN (HGB A1C): Hemoglobin A1C: 5.4

## 2015-03-02 MED ORDER — ONDANSETRON 4 MG PO TBDP
4.0000 mg | ORAL_TABLET | Freq: Once | ORAL | Status: AC
  Administered 2015-03-02: 4 mg via ORAL

## 2015-03-02 MED ORDER — ONDANSETRON 4 MG PO TBDP: 4.0000 mg | ORAL_TABLET | Freq: Three times a day (TID) | ORAL | Status: DC | PRN

## 2015-03-02 MED ORDER — BUTALBITAL-APAP-CAFFEINE 50-325-40 MG PO TABS: 1.0000 | ORAL_TABLET | Freq: Four times a day (QID) | ORAL | Status: DC | PRN

## 2015-03-02 NOTE — Patient Instructions (Signed)

## 2015-03-02 NOTE — Progress Notes (Signed)
Chief Complaint:  Chief Complaint  Patient presents with  . Headache    x 4 days seen in er friday  . Numbness    right arm x 4 days  . Blurred Vision    HPI: Arthur Collins is a 24 y.o. male who is here for recheck of numbness and tingling in his right arm and persistent low-grade headache and was seen in ED on 5/13 at [facility], [place] and labs were negative. He was given hydrocodone and Phenergan but is really unable to use them. Hydrocodone knocks him out, helps with sleep. Phenergan makes him drowsy. He was given Toradol in the ED but that just gave him a bruise and it still hurts. He did not think that it actually helped. No neck pain, no hx of carpal tunnel . No recent traumas. He denies vision changes. He does get slightly dizzy. 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 He pulled a double shift that day. He has worked 16 hours before without problems.  Associated symptoms include Some nausea no vo,minting. No neck pain, no new vaccines. No new meds, no supplements.Tingling as been persistent, started stuttuering yesterday afternoon, he has also been dizzy, he has intemrittnet HA and then over eases up and then it is a sharp stabbing pain in the upper right temporal aand occiput lasts for about a couple of seocnds or minutes. No new foods, no new meds, no new supplements. An ice confusion. He had some spotting initially but does not have any spotting in his visual field currently.  Prior to feed from [place] is a 24 y.o. male who present to the Emergency Department complaining of a waxing and waning, parietal, temporal HA that began yesterday at 6pm. Pt states that while at work he developed sharp, parietal,temporal HA. He rates current HA 6/10 but reports at its worse HA is a 10/10. He reports todays about 3 hours go while working he began to have numbness and  tingling in right arm. Pt has had visual changes that he describes as spotting. Pt denies nausea, emesis, fever, chills, cough, sore throat, CP, SOB, abdominal pain, nausea, emesis, diarrhea, rash, leg swelling, bleeding eaisly, dyusira, back pain, dizziness. Past Medical History  Diagnosis Date  . GERD (gastroesophageal reflux disease)    Past Surgical History  Procedure Laterality Date  . Orif humerus fracture    . Fracture surgery     History   Social History  . Marital Status: Single    Spouse Name: N/A  . Number of Children: N/A  . Years of Education: N/A   Social History Main Topics  . Smoking status: Current Every Day Smoker -- 0.50 packs/day    Types: Cigarettes  . Smokeless tobacco: Not on file  . Alcohol Use: Yes     Collins: occasionally  . Drug Use: No  . Sexual Activity: Not on file   Other Topics Concern  . None   Social History Narrative   History reviewed. No pertinent family history. No Known Allergies Prior to Admission medications   Medication Sig Start Date End Date Taking? Authorizing Provider  HYDROcodone-acetaminophen (NORCO/VICODIN) 5-325 MG per tablet Take 1-2 tablets by mouth every 6 (six) hours as needed. 02/27/15  Yes Arthur Sorrow, MD  promethazine (PHENERGAN) 25 MG tablet Take 1 tablet (25 mg total)  by mouth every 6 (six) hours as needed for nausea. 07/19/11 02/27/16 Yes Arthur Porter, MD     ROS: The patient denies fevers, chills, night sweats, unintentional weight loss, chest pain, palpitations, wheezing, dyspnea on exertion, nausea, vomiting, abdominal pain, dysuria, hematuria, melena  All other systems have been reviewed and were otherwise negative with the exception of those mentioned in the HPI and as above.    PHYSICAL EXAM: Filed Vitals:   03/02/15 1711  BP: 130/68  Pulse: 72  Temp: 98.8 F (37.1 C)  Resp: 16   Filed Vitals:   03/02/15 1711  Height: 5\' 8"  (1.727 m)  Weight: 249 lb 9.6 oz (113.218 kg)   Body mass index is  37.96 kg/(m^2).  General: Alert, no acute distress HEENT:  Normocephalic, atraumatic, oropharynx patent. EOMI, PERRLA. Endoscopic exam normal. No appreciable papilledema TM normal. Cardiovascular:  Regular rate and rhythm, no rubs murmurs or gallops.  No Carotid bruits, radial pulse intact. No pedal edema.  Respiratory: Clear to auscultation bilaterally.  No wheezes, rales, or rhonchi.  No cyanosis, no use of accessory musculature GI: No organomegaly, abdomen is soft and non-tender, positive bowel sounds.  No masses. Skin: No rashes. Neurologic: Facial musculature symmetric. Cranial nerves II-12 grossly intact, no appreciable nystagmus. Psychiatric: Patient is appropriate throughout our interaction. Lymphatic: No cervical lymphadenopathy Musculoskeletal: Gait intact. 5 out of 5 strength, sensation intact to attitude DTRs   LABS: Results for orders placed or performed in visit on 03/02/15  POCT CBC  Result Value Ref Range   WBC 8.8 4.6 - 10.2 K/uL   Lymph, poc 2.3 0.6 - 3.4   POC LYMPH PERCENT 26.2 10 - 50 %L   MID (cbc) 0.4 0 - 0.9   POC MID % 4.2 0 - 12 %M   POC Granulocyte 6.1 2 - 6.9   Granulocyte percent 69.6 37 - 80 %G   RBC 5.44 4.69 - 6.13 M/uL   Hemoglobin 15.0 14.1 - 18.1 g/dL   HCT, POC 47.0 43.5 - 53.7 %   MCV 86.5 80 - 97 fL   MCH, POC 27.5 27 - 31.2 pg   MCHC 31.8 31.8 - 35.4 g/dL   RDW, POC 13.9 %   Platelet Count, POC 239 142 - 424 K/uL   MPV 8.4 0 - 99.8 fL  POCT glycosylated hemoglobin (Hb A1C)  Result Value Ref Range   Hemoglobin A1C 5.4      EKG/XRAY:   Primary read interpreted by Arthur Collins at [facility].   ASSESSMENT/PLAN: Encounter Diagnoses  Name Primary?  . Other headache syndrome Yes  . Tingling in extremities   . Nausea without vomiting   . Dizziness and giddiness    He was given IV fluids in the office and initially was slightly better but then stated that it was the same. He declined any Toradol. Rx Fioricet, push fluids at home Refer to  neurology MRI brain with and without contrast rule out any etiologies for persistent headache  Sedimentation rate pending  Gross sideeffects, risk and benefits, and alternatives of medications d/w patient. Patient is aware that all medications have potential sideeffects and we are unable to predict every sideeffect or drug-drug interaction that may occur.  Aman Batley, [place], DO 03/02/2015 6:58 PM

## 2015-03-03 LAB — TSH: TSH: 1.983 u[IU]/mL (ref 0.350–4.500)

## 2015-03-06 ENCOUNTER — Telehealth: Payer: Self-pay

## 2015-03-06 ENCOUNTER — Telehealth: Payer: Self-pay | Admitting: Family Medicine

## 2015-03-06 ENCOUNTER — Ambulatory Visit
Admission: RE | Admit: 2015-03-06 | Discharge: 2015-03-06 | Disposition: A | Discharge status: Home / Self Care | Payer: 59 | Source: Ambulatory Visit | Attending: Family Medicine | Admitting: Family Medicine

## 2015-03-06 DIAGNOSIS — G43809 Other migraine, not intractable, without status migrainosus: Secondary | ICD-10-CM

## 2015-03-06 DIAGNOSIS — G4489 Other headache syndrome: Secondary | ICD-10-CM

## 2015-03-06 MED ORDER — GADOBENATE DIMEGLUMINE 529 MG/ML IV SOLN
20.0000 mL | Freq: Once | INTRAVENOUS | Status: AC | PRN
  Administered 2015-03-06: 20 mL via INTRAVENOUS

## 2015-03-06 MED ORDER — SUMATRIPTAN SUCCINATE 25 MG PO TABS: 25.0000 mg | ORAL_TABLET | ORAL | Status: DC | PRN

## 2015-03-06 NOTE — Telephone Encounter (Signed)
Spoke with patient, refer to neurology. Trial of imitrex.

## 2015-03-06 NOTE — Telephone Encounter (Signed)
Dr. Le?

## 2015-03-06 NOTE — Telephone Encounter (Signed)
Normal MRI, he can go home, most likely migraine HAs. Will call him later

## 2015-03-06 NOTE — Telephone Encounter (Signed)
°  Dr. Marin Comment  Patient just finished his scan and is wondering what to do next.  He lives 45 minutes away.    Please call ASAP  [phone]

## 2015-03-06 NOTE — Telephone Encounter (Signed)
Spoke with pt, advised message from Dr. Marin Comment. Pt understood.

## 2015-03-11 ENCOUNTER — Encounter: Payer: Self-pay | Admitting: Neurology

## 2015-03-11 ENCOUNTER — Ambulatory Visit (INDEPENDENT_AMBULATORY_CARE_PROVIDER_SITE_OTHER): Payer: 59 | Admitting: Neurology

## 2015-03-11 VITALS — BP 120/80 | HR 76 | Resp 20 | Ht 69.29 in | Wt 245.0 lb

## 2015-03-11 DIAGNOSIS — R208 Other disturbances of skin sensation: Secondary | ICD-10-CM | POA: Diagnosis not present

## 2015-03-11 DIAGNOSIS — R51 Headache: Secondary | ICD-10-CM | POA: Diagnosis not present

## 2015-03-11 DIAGNOSIS — H8112 Benign paroxysmal vertigo, left ear: Secondary | ICD-10-CM | POA: Diagnosis not present

## 2015-03-11 DIAGNOSIS — R231 Pallor: Secondary | ICD-10-CM

## 2015-03-11 DIAGNOSIS — G4733 Obstructive sleep apnea (adult) (pediatric): Secondary | ICD-10-CM | POA: Diagnosis not present

## 2015-03-11 DIAGNOSIS — H8110 Benign paroxysmal vertigo, unspecified ear: Secondary | ICD-10-CM | POA: Insufficient documentation

## 2015-03-11 DIAGNOSIS — Z9989 Dependence on other enabling machines and devices: Secondary | ICD-10-CM

## 2015-03-11 DIAGNOSIS — R519 Headache, unspecified: Secondary | ICD-10-CM

## 2015-03-11 HISTORY — DX: Obstructive sleep apnea (adult) (pediatric): G47.33

## 2015-03-11 NOTE — Patient Instructions (Signed)
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 Today is 03/11/2015 this would take them out until fourth of  June 2016.

## 2015-03-11 NOTE — Progress Notes (Signed)
[facility]   Provider:  Larey Seat, M D  Referring Provider: Glenford Bayley, DO Primary Care Physician:  No PCP Per Patient  Chief Complaint  Patient presents with  . Headache    rm 11, new patient, with brother    HPI:  Arthur Collins is a 24 y.o. male , seen here as a referral from Dr. 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 He developed on a Thursday afternoon about 14 days ago a angling dysesthesia of the right arm. He feels he sleeps in daytime and she woke up with this abnormal sensation as if his arm was still asleep. He worked that night shift and the next morning presented to the emergency room as it has worsened. His symptoms increased in intensity and he now also felt that his right head was hurting he felt puffy swollen and hot. All this affected only the right side of his head. The headache was not characteristically characterized as burning or throbbing it was a sharp stabbing sensation as if a hot needle was inserted into his head. Also had a pulsating pressure behind the eye. Both eyes now now are involved 14 days later but they alternate - they're not involved at the same time. The patient drinks during the day and during the night shift caffeine in form of iced tea and soda. He reports that he has switched from ice tea to Soder over the last 14 days but he is not sure that his taste or smell has been changing in any way. His appetite has been the same. He has an associated vertigo sensation no the room is always spinning he reports. The vertigo is also measurable in a different way when he reads he has to focus  More on the  the lines he is reading-  his gaze is not tracing or scanning as he wants to reads. He has noted a mild back pain, too. Lower back, not flank.   I  quote further from his previous note from 03-02-15 with Dr[facility]. "Vertebral that the  patient presented various 4-1/2 days of headaches and of right arm numbness. Tingling and persistent low-grade headaches when seen in the ED at any [facility] in [place] a CT and labs there were negative" . The headache was described at that time is thoracic waxing and waning parietotemporal temporal right-sided headache that began at 6 PM the preceding day. It became sharp and stabbing.  The headache was rated 6 out of 10 in intensity. Nausea, emesis, visual changes, cough, sore throat. Dizziness and vertigo and vertigo sensation.   The patient is a current smoker about 10 cigarettes a day he drinks a lot of caffeinated beverages he does not use recreational drugs, and he drinks alcohol. He was given Hydrea codon by Dr. 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 His CBC showed a normal white blood cell count normal red blood cell count differential was normal. CT showed no acute infarct it was followed by an MRI of the head with and without contrast on 03-06-15 , which again was unremarkable; no abnormal signals were noted.     Review of Systems: Out of a complete 14 system review, the patient complains of only the  following symptoms, and all other reviewed systems are negative. Shift work, caffeine overuse, nausea, stabbing headache, head is hot. Vertigo.   No palpable lymph nodes swelling but it is evident that the patient has been clammy and slightly diaphoretic.     History   Social History  . Marital Status: Single    Spouse Name: N/A  . Number of Children: N/A  . Years of Education: N/A   Occupational History  . Not on file.   Social History Main Topics  . Smoking status: Current Every Day Smoker -- 0.50 packs/day    Types: Cigarettes  . Smokeless tobacco: Not on file  . Alcohol Use: Yes     Comment: occasionally  . Drug Use: No  . Sexual Activity: Not on  file   Other Topics Concern  . Not on file   Social History Narrative   Drinks 2-3 cups of caffeine.    Family History  Problem Relation Age of Onset  . Diabetes Mother   . Hypertension Mother     Past Medical History  Diagnosis Date  . GERD (gastroesophageal reflux disease)   . Frequent headaches     Past Surgical History  Procedure Laterality Date  . Orif humerus fracture    . Fracture surgery      Current Outpatient Prescriptions  Medication Sig Dispense Refill  . SUMAtriptan (IMITREX) 25 MG tablet Take 1 tablet (25 mg total) by mouth every 2 (two) hours as needed for migraine. Do not use more than 200 mg daily. 10 tablet 0  . butalbital-acetaminophen-caffeine (FIORICET) 50-325-40 MG per tablet Take 1-2 tablets by mouth every 6 (six) hours as needed for headache. (Patient not taking: Reported on 03/11/2015) 20 tablet 0  . ondansetron (ZOFRAN ODT) 4 MG disintegrating tablet Take 1 tablet (4 mg total) by mouth every 8 (eight) hours as needed for nausea or vomiting. (Patient not taking: Reported on 03/11/2015) 20 tablet 0   No current facility-administered medications for this visit.    Allergies as of 03/11/2015  . (No Known Allergies)    Vitals: BP 120/80 mmHg  Pulse 76  Resp 20  Ht 5' 9.29" (1.76 m)  Wt 245 lb (111.131 kg)  BMI 35.88 kg/m2 Last Weight:  Wt Readings from Last 1 Encounters:  03/11/15 245 lb (111.131 kg)       Last Height:   Ht Readings from Last 1 Encounters:  03/11/15 5' 9.29" (1.76 m)    Physical exam:  General: The patient is awake, alert and appears not in acute distress. The patient is well groomed. Head: Normocephalic, atraumatic. Neck is supple. Mallampati 4   neck circumference: 17 . Nasal airflow unrestriced , TMJ is not  evident . Retrognathia is not seen.  Cardiovascular:  Regular rate and rhythm , without  murmurs or carotid bruit, and without distended neck veins. Respiratory: Lungs are clear to auscultation. Skin:  Without  evidence of edema, or rash Trunk: BMI is  elevated and patient  has normal posture.  Neurologic exam : The patient is awake and alert, oriented to place and time.   Memory subjective described as intact.  There is a normal attention span & concentration ability. Speech is fluent without  dysarthria, dysphonia or aphasia. Mood and affect are appropriate.  Cranial nerves: Pupils are equal and briskly reactive to light. Funduscopic exam without   evidence of pallor or edema.  Extraocular movements  in vertical and horizontal planes intact , with gaze to the left side there  is an end point nystagmus.  Visual fields by finger perimetry are intact. Hearing to finger rub intact.  Facial sensation intact to fine touch. Patient reports that the right side of the face feels puffy swollen and hot. He is slightly diaphoretic. Facial motor strength is symmetric and tongue and uvula move midline.  Motor exam:   Normal tone ,muscle bulk and symmetric ,strength in all extremities.  Sensory:  Fine touch, pinprick and vibration were tested in all extremities.  Proprioception is tested in the upper extremities only. This was  normal.  Coordination: Rapid alternating movements in the fingers/hands is normal. Finger-to-nose maneuver  normal without evidence of ataxia, dysmetria or tremor. Rapid head movement allow the patient to describe his vertigo as a counterclockwise rotation sensation. Flexion and retroflexion at the neck also caused him to feel as if he would fall forward. This seems to be a slight drift to the left side with a Romberg but he was not at risk of falling. His own subjective impression was that he was drifting forward or to the left.  Gait and station: Patient walks without assistive device and is able unassisted to climb up to the exam table. Strength within normal limits. Stance is stable and normal. Tandem gait is unfragmented. Romberg testing is   negative.  Deep tendon reflexes: in the   upper and lower extremities are symmetric and intact. Babinski maneuver response is  downgoing.   Assessment:  After physical and neurologic examination, review of laboratory studies, imaging, neurophysiology testing and pre-existing records, assessment is   1) the patient has a right-sided head pain and right-sided arm dysesthesia, and he has vertigo or feeling of being sweaty and puffy. Gladly I can rely on the already performed CT and MRI is of the brain as ruling out any abnormalities there CT and labs were negative MRI that followed on 520 was negative. I do consider that this could be a complicated migraine especially since he has a latent nausea, the headache is described as a sharp stabbing sensation. 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  I order a sedimentation rate, a protein C which is a general inflammatory marker.  I will use a migraine prevention and to see if that will help reducing his pain level I certainly don't find oxycodone or hydrocodone at this time helpful for patient , with a new onset headache syndrome. We can either used topiramate or Depakote as a preventer of migrainous headaches. Since dysesthesias are involved I prefer the Depakote. I will also order an MRI of the C-spine. And mechanism for the vertigo sensation. Since vertigo was easily provoked ,   it is a benign positional vertigo and we will refer the patient to vestibular rehabilitation (can be done at [facility] or here at [facility]).   The patient was advised of the nature of the diagnosed  disorder , the treatment options and risks for general a health and wellness arising from not treating the condition. Visit duration was 45 minutes.   Plan:   Vest rehab, C RP , sed rate ,  Rv with Np in 21 days.       Asencion Partridge Kevante Lunt MD  03/11/2015

## 2015-03-13 LAB — PROTEIN ELECTROPHORESIS, SERUM
A/G RATIO SPE: 1.2 (ref 0.7–2.0)
ALBUMIN ELP: 4 g/dL (ref 3.2–5.6)
ALPHA 1: 0.3 g/dL (ref 0.1–0.4)
Alpha 2: 0.7 g/dL (ref 0.4–1.2)
Beta: 1.2 g/dL (ref 0.6–1.3)
GLOBULIN, TOTAL: 3.4 g/dL (ref 2.0–4.5)
Gamma Globulin: 1.2 g/dL (ref 0.5–1.6)
Total Protein: 7.4 g/dL (ref 6.0–8.5)

## 2015-03-13 LAB — SEDIMENTATION RATE: Sed Rate: 10 mm/hr (ref 0–15)

## 2015-03-13 LAB — ROCKY MTN SPOTTED FVR ABS PNL(IGG+IGM)
RMSF IGG: NEGATIVE
RMSF IgM: 0.46 index (ref 0.00–0.89)

## 2015-03-13 LAB — C-REACTIVE PROTEIN: CRP: 1.9 mg/L (ref 0.0–4.9)

## 2015-03-18 ENCOUNTER — Ambulatory Visit (INDEPENDENT_AMBULATORY_CARE_PROVIDER_SITE_OTHER): Payer: Self-pay | Admitting: Neurology

## 2015-03-18 ENCOUNTER — Ambulatory Visit (INDEPENDENT_AMBULATORY_CARE_PROVIDER_SITE_OTHER): Payer: 59 | Admitting: Neurology

## 2015-03-18 ENCOUNTER — Telehealth: Payer: Self-pay

## 2015-03-18 ENCOUNTER — Other Ambulatory Visit: Payer: Self-pay

## 2015-03-18 ENCOUNTER — Encounter: Payer: Self-pay | Admitting: Neurology

## 2015-03-18 VITALS — BP 118/76 | HR 80 | Resp 20 | Ht 67.72 in | Wt 245.0 lb

## 2015-03-18 DIAGNOSIS — H8112 Benign paroxysmal vertigo, left ear: Secondary | ICD-10-CM

## 2015-03-18 DIAGNOSIS — R51 Headache: Secondary | ICD-10-CM

## 2015-03-18 DIAGNOSIS — Z0289 Encounter for other administrative examinations: Secondary | ICD-10-CM

## 2015-03-18 DIAGNOSIS — R208 Other disturbances of skin sensation: Secondary | ICD-10-CM

## 2015-03-18 DIAGNOSIS — G43111 Migraine with aura, intractable, with status migrainosus: Secondary | ICD-10-CM

## 2015-03-18 DIAGNOSIS — R519 Headache, unspecified: Secondary | ICD-10-CM

## 2015-03-18 DIAGNOSIS — R231 Pallor: Secondary | ICD-10-CM

## 2015-03-18 DIAGNOSIS — R202 Paresthesia of skin: Secondary | ICD-10-CM

## 2015-03-18 MED ORDER — ZECUITY 6.5 MG/4HR TD PTCH: 6.5000 mg | MEDICATED_PATCH | TRANSDERMAL | Status: DC | PRN

## 2015-03-18 NOTE — Telephone Encounter (Signed)
Spoke with patient to schedule appointment for [place] Vasomatoreactivity

## 2015-03-18 NOTE — Telephone Encounter (Signed)
[facility] sample was given during visit. Lot: [id number] Exp: 02/2016

## 2015-03-18 NOTE — Telephone Encounter (Signed)
Patient informed that all labs results were negative.  He has no questions at this time.

## 2015-03-18 NOTE — Progress Notes (Signed)
[facility]   Provider:  Larey Seat, M D  Referring Provider: No ref. provider found Primary Care Physician:  No PCP Per Patient  Chief Complaint  Patient presents with  . Follow-up    mirgraines, rm 10 ,with niece    HPI:  Arthur Collins is a 24 y.o. male , seen here as a referral from Dr. No ref. 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 He developed on a Thursday afternoon about 14 days ago a angling dysesthesia of the right arm. He feels he sleeps in daytime and she woke up with this abnormal sensation as if his arm was still asleep. He worked that night shift and the next morning presented to the emergency room as it has worsened. His symptoms increased in intensity and he now also felt that his right head was hurting he felt puffy swollen and hot. All this affected only the right side of his head. The headache was not characteristically characterized as burning or throbbing it was a sharp stabbing sensation as if a hot needle was inserted into his head. Also had a pulsating pressure behind the eye. Both eyes now now are involved 14 days later but they alternate - they're not involved at the same time. The patient drinks during the day and during the night shift caffeine in form of iced tea and soda. He reports that he has switched from ice tea to Soder over the last 14 days but he is not sure that his taste or smell has been changing in any way. His appetite has been the same. He has an associated vertigo sensation no the room is always spinning he reports. The vertigo is also measurable in a different way when he reads he has to focus  More on the  the lines he is reading-  his gaze is not tracing or scanning as he wants to reads. He has noted a mild back pain, too. Lower back, not flank.   I  quote further from his previous note from 03-02-15 with Dr[facility]. "Vertebral that the patient presented various 4-1/2 days of headaches and of right arm numbness. Tingling and persistent low-grade headaches when seen in the ED at any [facility] in [place] a CT and labs there were negative" . The headache was described at that time is thoracic waxing and waning parietotemporal temporal right-sided headache that began at 6 PM the preceding day. It became sharp and stabbing.  The headache was rated 6 out of 10 in intensity. Nausea, emesis, visual changes, cough, sore throat. Dizziness and vertigo and vertigo sensation.  The patient is a current smoker about 10 cigarettes a day he drinks a lot of caffeinated beverages he does not use recreational drugs, and he drinks alcohol. He was given Hydr0codon by Dr. 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  His CBC showed a normal white blood cell count normal red blood cell count differential was normal. CT showed no acute infarct it was followed by an MRI of the head with and without contrast on 03-06-15 , which again was unremarkable; no abnormal signals were noted. 1) the patient has a right-sided head pain and right-sided arm dysesthesia, and he has vertigo  or feeling of being sweaty and puffy. Gladly I can rely on the already performed CT and MRI is of the brain as ruling out any abnormalities there CT and labs were negative MRI that followed on 520 was negative. I do consider that this could be a complicated migraine especially since he has a latent nausea, the headache is described as a sharp stabbing sensation. 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  I order a sedimentation rate, a protein C which is a general inflammatory marker.  I will use a migraine prevention and to see if that  will help reducing his pain level I certainly don't find oxycodone or hydrocodone at this time helpful for patient , with a new onset headache syndrome. We can either used topiramate or Depakote as a preventer of migrainous headaches. Since dysesthesias are involved I prefer the Depakote. I will also order an MRI of the C-spine. And mechanism for the vertigo sensation. Since vertigo was easily provoked ,   it is a benign positional vertigo and we will refer the patient to vestibular rehabilitation (can be done at [facility] or here at [facility]).  Vest rehab, C RP , sed rate ,  Rv with Np in 21 days.   Interval history from 03-18-15 Arthur Collins is seen here today for a nerve conduction study EMG. At the same time he presented with worsening vertigo and nausea. He still has daily headaches but they're no longer left or right-sided dominant. Does seem to affect the center of the for head. He could not tolerate the Imitrex very well and made him more nauseated. Based on this I would like for the patient to start the Imitrex as a patch. It did affect his headaches positively but the side effect panel from an oral preparation was not tolerated well.  Whenever he moves fast , his vertigo is triggered. He moves slowly, he stands slowly up from a seated position, avoid rapid head movements. He has a cough.  Based on his normal MRI of the brain and CT and the normal lab tests, I feel we need to address this as a migraine. EMG was just done today, await results.  He will have additional TCD for vertebro basilar  flow .        Review of Systems: Out of a complete 14 system review, the patient complains of only the following symptoms, and all other reviewed systems are negative. Shift work, caffeine overuse, nausea, stabbing headache, head is hot. Vertigo.   No palpable lymph nodes swelling but it is evident that the patient has been clammy and slightly diaphoretic.     History   Social History  .  Marital Status: Single    Spouse Name: N/A  . Number of Children: N/A  . Years of Education: N/A   Occupational History  . Not on file.   Social History Main Topics  . Smoking status: Current Every Day Smoker -- 0.50 packs/day    Types: Cigarettes  . Smokeless tobacco: Not on file  . Alcohol Use: Yes     Comment: occasionally  . Drug Use: No  . Sexual Activity: Not on file   Other Topics Concern  . Not on file   Social History Narrative   Drinks 2-3 cups of caffeine.    Family History  Problem  Relation Age of Onset  . Diabetes Mother   . Hypertension Mother     Past Medical History  Diagnosis Date  . GERD (gastroesophageal reflux disease)   . Frequent headaches   . OSA on CPAP 03/11/2015    Dr Lanny Hurst clance.     Past Surgical History  Procedure Laterality Date  . Orif humerus fracture    . Fracture surgery      Current Outpatient Prescriptions  Medication Sig Dispense Refill  . butalbital-acetaminophen-caffeine (FIORICET) 50-325-40 MG per tablet Take 1-2 tablets by mouth every 6 (six) hours as needed for headache. (Patient not taking: Reported on 03/11/2015) 20 tablet 0  . ondansetron (ZOFRAN ODT) 4 MG disintegrating tablet Take 1 tablet (4 mg total) by mouth every 8 (eight) hours as needed for nausea or vomiting. (Patient not taking: Reported on 03/11/2015) 20 tablet 0  . SUMAtriptan (IMITREX) 25 MG tablet Take 1 tablet (25 mg total) by mouth every 2 (two) hours as needed for migraine. Do not use more than 200 mg daily. (Patient not taking: Reported on 03/18/2015) 10 tablet 0   No current facility-administered medications for this visit.    Allergies as of 03/18/2015  . (No Known Allergies)    Vitals: BP 118/76 mmHg  Pulse 80  Resp 20  Ht 5' 7.72" (1.72 m)  Wt 245 lb (111.131 kg)  BMI 37.56 kg/m2 Last Weight:  Wt Readings from Last 1 Encounters:  03/18/15 245 lb (111.131 kg)       Last Height:   Ht Readings from Last 1 Encounters:  03/18/15 5' 7.72"  (1.72 m)    Physical exam:  General: The patient is awake, alert and appears not in acute distress. The patient is well groomed. Head: Normocephalic, atraumatic. Neck is supple. Mallampati 4   neck circumference: 17 . Nasal airflow unrestriced , TMJ is not  evident . Retrognathia is not seen.  Cardiovascular:  Regular rate and rhythm , without  murmurs or carotid bruit, and without distended neck veins. Respiratory: Lungs are clear to auscultation. Skin:  Without evidence of edema, or rash Trunk: BMI is  elevated and patient  has normal posture.  Neurologic exam : The patient is awake and alert, oriented to place and time.   Memory subjective described as intact.  There is a normal attention span & concentration ability. Speech is fluent without  dysarthria, dysphonia or aphasia. Mood and affect are appropriate.  Cranial nerves: Pupils are equal and briskly reactive to light. Funduscopic exam without   evidence of pallor or edema.  Extraocular movements  in vertical and horizontal planes intact , with gaze to the left side there is an end point nystagmus.  Visual fields by finger perimetry are intact. Hearing to finger rub intact.  Facial sensation intact to fine touch. Patient reports that the right side of the face feels puffy swollen and hot. He is slightly diaphoretic. Facial motor strength is symmetric and tongue and uvula move midline.  Motor exam:   Normal tone ,muscle bulk and symmetric ,strength in all extremities.  Sensory:  Fine touch, pinprick and vibration were tested in all extremities.  Proprioception is tested in the upper extremities only. This was  normal.  Coordination: Rapid alternating movements in the fingers/hands is normal. Finger-to-nose maneuver  normal without evidence of ataxia, dysmetria or tremor. Rapid head movement allow the patient to describe his vertigo as a counterclockwise rotation sensation. Flexion and retroflexion at the neck also caused him to  feel as  if he would fall forward. This seems to be a slight drift to the left side with a Romberg but he was not at risk of falling. His own subjective impression was that he was drifting forward or to the left.  Gait and station: Patient walks without assistive device and is able unassisted to climb up to the exam table. Strength within normal limits. Stance is stable and normal. Tandem gait is unfragmented. Romberg testing is   negative.  Deep tendon reflexes: in the  upper and lower extremities are symmetric and intact. Babinski maneuver response is  downgoing.   Assessment:  After physical and neurologic examination, review of laboratory studies, imaging, neurophysiology testing and pre-existing records, assessment is   Since Arthur Collins has a very nausea prevalent response to his migraines I have given him security. He could not keep the oral medication down easily. Security also will allow him hopefully to return to work soon. I prescribed him for patches so that he can see how the medication is helping or not. In addition I will order for him to go through some vestibular rehabilitation to further evaluate his vertigo. I will order a transcranial Doppler study was Dr. Leonie Man. Visit duration was 20 minutes C. Makailyn Mccormick M.D.    The patient was advised of the nature of the diagnosed  disorder , the treatment options and risks for general a health and wellness arising from not treating the condition. Visit duration was 25 minutes.   Plan:   Vestibular rehab evaluation at [facility],  [facility]. TCD , vertebro basilar evaluation.        Asencion Partridge Doyel Mulkern MD  03/18/2015

## 2015-03-18 NOTE — Progress Notes (Signed)
  [facility]    Provider:  Dr Jaynee Eagles Referring Provider: Larey Seat, MD  History:  Arthur Collins is a 24 y.o. male here for evaluation of right arm pain. Tingling of the whole right arm. Denies numbness or burning. Like the arm is falling asleep. Endorses some neck pain without radicular symptoms. Denies right arm weakness, not dropping things, grip is ok.    Summary  Nerve conduction studies were performed on the right upper extremity:  The right Median motor nerve showed normal conductions with normal F Wave latency The right Ulnar motor nerve showed normal conductions with normal F Wave latency The right Radial motor nerve showed normal conductions with normal F Wave latency The right second-digit Median sensory nerve conduction was within normal limits The right fifth-digit Ulnar sensory nerve conduction was within normal limits The right Radial sensory nerve conduction was within normal limits  EMG Needle study was performed on selected right upper muscles:   The Deltoid, Triceps, Pronator Teres, Opponens Pollicis, First Dorsal interosseous muscles and right C7/C8 paraspinals were within normal limits.  Conclusion: This is a normal study. No electrophysiologic evidence for ulnar or median neuropathy, peripheral polyneuropathy or radiculopathy.  Sarina Ill, MD  [facility] [address] [place] , [place] [address]  Phone [phone] Fax [phone]

## 2015-03-18 NOTE — Procedures (Signed)
[facility]    Provider:  Dr Jaynee Eagles Referring Provider: Larey Seat, MD  History:  Arthur Collins is a 24 y.o. male here for evaluation of right arm pain. Tingling of the whole right arm. Denies numbness or burning. Like the arm is falling asleep. Endorses some neck pain without radicular symptoms. Denies right arm weakness, not dropping things, grip is ok.    Summary  Nerve conduction studies were performed on the right upper extremity:  The right Median motor nerve showed normal conductions with normal F Wave latency The right Ulnar motor nerve showed normal conductions with normal F Wave latency The right Radial motor nerve showed normal conductions with normal F Wave latency The right second-digit Median sensory nerve conduction was within normal limits The right fifth-digit Ulnar sensory nerve conduction was within normal limits The right Radial sensory nerve conduction was within normal limits  EMG Needle study was performed on selected right upper muscles:   The Deltoid, Triceps, Pronator Teres, Opponens Pollicis, First Dorsal interosseous muscles and right C7/C8 paraspinals were within normal limits.  Conclusion: This is a normal study. No electrophysiologic evidence for ulnar or median neuropathy, peripheral polyneuropathy or radiculopathy.  Sarina Ill, MD  [facility] [address] [place], [place] [address]  Phone [phone] Fax [phone]

## 2015-03-18 NOTE — Progress Notes (Signed)
Quick Note:  Called and spoke to patient he is aware labs normal. ______

## 2015-03-18 NOTE — Progress Notes (Signed)
Please see procedure note.

## 2015-03-19 ENCOUNTER — Telehealth: Payer: Self-pay | Admitting: Neurology

## 2015-03-19 DIAGNOSIS — G43909 Migraine, unspecified, not intractable, without status migrainosus: Secondary | ICD-10-CM

## 2015-03-19 NOTE — Telephone Encounter (Signed)
I called the patient to explain the [employer] enrollment process.  Got no answer.  Left message.  Since this is a specialty med, it cannot be ibtained through the local retail pharmacy.  [employer] will enroll patient, do a benefit investigation, then contact patient with coverage, co-pay and shipping info.

## 2015-03-19 NOTE — Telephone Encounter (Signed)
He left message with answering service and I talked with him. He continues to have headaches and will not be able to get his acuity patches for a couple weeks. He would like to know if he can have a couple more samples or some other option area

## 2015-03-19 NOTE — Telephone Encounter (Signed)
Patient would like a call to discuss imitrex per discussion with the physician in previous visit.

## 2015-03-19 NOTE — Telephone Encounter (Signed)
Returned pt's phone call. He is inquiring about when he can get the zecuity patches ordered yesterday. I informed him that we faxed it to the program that dispenses zecuity but that I would ask our pharmacy tech for more information. He also inquired about his appt Monday with NP Hassell Done since he just saw Dr. Brett Fairy yesterday. Per Dr. Edwena Felty instructions, he was to return in 4 weeks from yesterday for NP visit. Appt made on 7/5 with NP Hassell Done. I informed him that either I or Janett Billow would call him back re: zecuity patches. He verbalized understanding.

## 2015-03-20 ENCOUNTER — Other Ambulatory Visit: Payer: Self-pay | Admitting: *Deleted

## 2015-03-20 MED ORDER — SUMATRIPTAN SUCCINATE 6.5 MG/4HR TD PTCH: 6.5000 mg | MEDICATED_PATCH | TRANSDERMAL | Status: DC | PRN

## 2015-03-20 NOTE — Telephone Encounter (Signed)
Pt did pick up

## 2015-03-20 NOTE — Telephone Encounter (Signed)
I called and spoke to [place] with [employer] support services.  For samples, they would contact our rep and they would bring more.  (they do not overnight samples to pts).  2 samples are placed up front for pt.  [place] min away.    I relayed office closes 1200.   Two samples given Lot [id number], May/2017.   Placed up front.

## 2015-03-20 NOTE — Telephone Encounter (Signed)
This [place] will actually contact him by phone the day off or the day after security was prescribed. This form was filled here in the office and faxed to the [place] office. He should get up to 2 patches for free as a trial before being charged. All the required forms were filled here in the office. I hope that he was contacted by phone and answered the phone. He will not get the medication by mail until he had a successful contact with their central pharmacy.

## 2015-03-20 NOTE — Addendum Note (Signed)
Addended byOliver Hum on: 03/20/2015 11:42 AM   Modules accepted: Orders

## 2015-03-23 ENCOUNTER — Other Ambulatory Visit: Payer: Self-pay

## 2015-03-23 ENCOUNTER — Ambulatory Visit (HOSPITAL_COMMUNITY)
Admission: RE | Admit: 2015-03-23 | Discharge: 2015-03-23 | Disposition: A | Discharge status: Home / Self Care | Payer: 59 | Source: Ambulatory Visit | Attending: Neurology | Admitting: Neurology

## 2015-03-23 ENCOUNTER — Telehealth: Payer: Self-pay | Admitting: Neurology

## 2015-03-23 ENCOUNTER — Telehealth: Payer: Self-pay

## 2015-03-23 ENCOUNTER — Ambulatory Visit: Payer: 59 | Admitting: Nurse Practitioner

## 2015-03-23 DIAGNOSIS — H8110 Benign paroxysmal vertigo, unspecified ear: Secondary | ICD-10-CM

## 2015-03-23 DIAGNOSIS — H8112 Benign paroxysmal vertigo, left ear: Secondary | ICD-10-CM

## 2015-03-23 DIAGNOSIS — G43111 Migraine with aura, intractable, with status migrainosus: Secondary | ICD-10-CM

## 2015-03-23 MED ORDER — MECLIZINE HCL 25 MG PO TABS: 25.0000 mg | ORAL_TABLET | Freq: Every day | ORAL | Status: DC

## 2015-03-23 MED ORDER — ZECUITY 6.5 MG/4HR TD PTCH: 6.5000 mg | MEDICATED_PATCH | TRANSDERMAL | Status: DC | PRN

## 2015-03-23 NOTE — Telephone Encounter (Signed)
Pt came by office to pick up a work note. Dr. Brett Fairy said to keep him out of work until 04/01/15 to give [place] time to get the TCD results. He rescheduled his TCD for Wednesday at 10:00. I informed him that I would call him when I got the results. He verbalized understanding.

## 2015-03-23 NOTE — Progress Notes (Signed)
Pt showed up to clinic wanting more zecuity samples because he hasn't received his zecuity RX yet from the specialty pharmacy. Gave him one sample. He also said he missed his TCD today. Gave him number to reschedule. Wanting dizziness medication and wants to know if he should return to work.  Will ask Dr. Brett Fairy.

## 2015-03-23 NOTE — Telephone Encounter (Signed)
Patient called and stated that Cyril Mourning RN asked him to call back after he set up his appt with the vascular center. He has requested the she return his call. Please call and advise.

## 2015-03-23 NOTE — Telephone Encounter (Signed)
Called to tell pt an RX for meclizine was sent to his pharmacy and that Dr. Brett Fairy recommended starting work after his TCD. No answer, left message asking him to call me back.

## 2015-03-25 ENCOUNTER — Ambulatory Visit (HOSPITAL_COMMUNITY)
Admission: RE | Admit: 2015-03-25 | Discharge: 2015-03-25 | Disposition: A | Discharge status: Home / Self Care | Payer: 59 | Source: Ambulatory Visit | Attending: Neurology | Admitting: Neurology

## 2015-03-25 DIAGNOSIS — G43111 Migraine with aura, intractable, with status migrainosus: Secondary | ICD-10-CM | POA: Diagnosis not present

## 2015-03-25 DIAGNOSIS — R42 Dizziness and giddiness: Secondary | ICD-10-CM | POA: Insufficient documentation

## 2015-03-25 DIAGNOSIS — G43909 Migraine, unspecified, not intractable, without status migrainosus: Secondary | ICD-10-CM | POA: Insufficient documentation

## 2015-03-25 NOTE — Progress Notes (Signed)
  Vascular Ultrasound Transcranial Doppler has been completed.    03/25/2015 10:42 AM Maudry Mayhew, RVT, RDCS, RDMS

## 2015-03-26 ENCOUNTER — Telehealth: Payer: Self-pay

## 2015-03-26 NOTE — Telephone Encounter (Signed)
-----   Message from Larey Seat, MD sent at 03/26/2015  4:52 PM EDT ----- Make Patient aware of normal EMG /NCS

## 2015-03-26 NOTE — Telephone Encounter (Signed)
Informed pt that his EMG/NCV was normal. Verbalized understanding.

## 2015-03-27 ENCOUNTER — Telehealth: Payer: Self-pay | Admitting: *Deleted

## 2015-03-27 NOTE — Telephone Encounter (Signed)
Forms,[employer] sent to [facility] and Dr Brett Fairy 03/27/15.

## 2015-03-30 ENCOUNTER — Telehealth: Payer: Self-pay | Admitting: *Deleted

## 2015-03-30 DIAGNOSIS — Z0289 Encounter for other administrative examinations: Secondary | ICD-10-CM

## 2015-03-30 NOTE — Telephone Encounter (Signed)
Matrix form completed by Dr. Brett Fairy and brought to Butch Penny in MR to be faxed.

## 2015-03-30 NOTE — Telephone Encounter (Signed)
Form,[employer] received,completed by Dr Brett Fairy and Cyril Mourning faxed 03/30/15.

## 2015-04-07 ENCOUNTER — Telehealth: Payer: Self-pay

## 2015-04-07 NOTE — Telephone Encounter (Signed)
Attempted to call pt again, no answer, left message asking him to call me back.

## 2015-04-07 NOTE — Telephone Encounter (Signed)
-----   Message from Larey Seat, MD sent at 04/06/2015  4:37 PM EDT ----- Normal TCD.

## 2015-04-07 NOTE — Telephone Encounter (Signed)
Called pt to tell him that his TCD was normal. No answer, left message asking him to call me back.

## 2015-04-07 NOTE — Telephone Encounter (Signed)
Relayed to pt that his TCD was normal. Pt verbalized understanding.

## 2015-04-22 ENCOUNTER — Ambulatory Visit: Payer: Self-pay | Admitting: Nurse Practitioner

## 2015-04-22 ENCOUNTER — Telehealth: Payer: Self-pay | Admitting: *Deleted

## 2015-04-22 NOTE — Telephone Encounter (Signed)
Pt no showed his appt today.

## 2015-04-23 ENCOUNTER — Encounter: Payer: Self-pay | Admitting: Nurse Practitioner

## 2015-05-11 ENCOUNTER — Ambulatory Visit (INDEPENDENT_AMBULATORY_CARE_PROVIDER_SITE_OTHER): Payer: 59

## 2015-05-11 ENCOUNTER — Ambulatory Visit (INDEPENDENT_AMBULATORY_CARE_PROVIDER_SITE_OTHER): Payer: 59 | Admitting: Family Medicine

## 2015-05-11 ENCOUNTER — Encounter: Payer: Self-pay | Admitting: *Deleted

## 2015-05-11 VITALS — BP 137/85 | HR 86 | Temp 98.6°F | Resp 18 | Ht 68.0 in | Wt 245.8 lb

## 2015-05-11 DIAGNOSIS — R208 Other disturbances of skin sensation: Secondary | ICD-10-CM | POA: Diagnosis not present

## 2015-05-11 DIAGNOSIS — R002 Palpitations: Secondary | ICD-10-CM | POA: Diagnosis not present

## 2015-05-11 DIAGNOSIS — R079 Chest pain, unspecified: Secondary | ICD-10-CM | POA: Diagnosis not present

## 2015-05-11 DIAGNOSIS — R42 Dizziness and giddiness: Secondary | ICD-10-CM

## 2015-05-11 DIAGNOSIS — R200 Anesthesia of skin: Secondary | ICD-10-CM

## 2015-05-11 LAB — POCT CBC
GRANULOCYTE PERCENT: 71.2 % (ref 37–80)
HEMATOCRIT: 46.3 % (ref 43.5–53.7)
HEMOGLOBIN: 15.5 g/dL (ref 14.1–18.1)
Lymph, poc: 2.3 (ref 0.6–3.4)
MCH: 27.9 pg (ref 27–31.2)
MCHC: 33.5 g/dL (ref 31.8–35.4)
MCV: 83.2 fL (ref 80–97)
MID (CBC): 0.6 (ref 0–0.9)
MPV: 7.8 fL (ref 0–99.8)
PLATELET COUNT, POC: 260 10*3/uL (ref 142–424)
POC GRANULOCYTE: 7.2 — AB (ref 2–6.9)
POC LYMPH PERCENT: 23 %L (ref 10–50)
POC MID %: 5.8 % (ref 0–12)
RBC: 5.56 M/uL (ref 4.69–6.13)
RDW, POC: 12.8 %
WBC: 10.1 10*3/uL (ref 4.6–10.2)

## 2015-05-11 NOTE — Progress Notes (Addendum)
Subjective:   This chart was scribed for Arthur Ray, MD by Erling Conte, Medical Scribe. This patient was seen in Room 13 and the patient's care was started at 8:15 PM.   Patient ID: Arthur Collins, male    DOB: 03-02-1991, 24 y.o.   MRN: [id number]  Chief Complaint  Patient presents with  . tingling in arms , chest pain    x 1 hr    HPI [name] is a 24 y.o. male   Chart Review Has a h/o migraine HAs, followed by Neurology. He was last seen here by Dr. Marin Comment on May 16th, 2016 after ER visit 3 days prior. Pt was having tingling in his extremities at that visit. MRI of brain was unremarkable on May 20th. Subsequently has had a transcranial doppler that was normal on June 8th. Addtionally he had an EKG on May 13th with sinus rhythm and probable early repolarization  He reports left sided chest pressure that began at 6:00 PM. He notes associated heart palpitations, SOB, diaphoresis, HA, lightheadedness, and tingling in both arms. Pt denies tingling in areas such as his mouth, toes or face and also denies any weakness. He denies radiation of chest pain. The chest pressure was initially 6/10 and is now waned to 2/10 pain.  He thought at first it may be an anxiety attack but states nothing caused it and denies any recent stress. He denies h/o anxiety or panic attacks. He denies any h/o heart problems or family history of heart problems. 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 Pt does not take any supplements, excessive caffeine intake, or current drug use. He states 4-5 years ago he used to smoke marijuana but has not done any since. No early sudden cardiac death. He denies any nausea or vomiting.   No recent prolonged car travel or air travel, no recent calf pain or swelling. No family history of blood clots.  Rare caffeine use.  No illicit drug use.    Patient Active Problem List   Diagnosis Date Noted  . Intractable migraine with aura with status  migrainosus 03/18/2015  . Clamminess 03/11/2015  . Headache behind the eyes 03/11/2015  . Dysesthesia of multiple sites 03/11/2015  . Benign paroxysmal positional vertigo 03/11/2015  . OSA on CPAP 03/11/2015   Past Medical History  Diagnosis Date  . GERD (gastroesophageal reflux disease)   . Frequent headaches   . OSA on CPAP 03/11/2015    Dr Lanny Hurst clance.    Past Surgical History  Procedure Laterality Date  . Orif humerus fracture    . Fracture surgery     No Known Allergies Prior to Admission medications   Medication Sig Start Date End Date Taking? Authorizing Provider  meclizine (ANTIVERT) 25 MG tablet Take 1 tablet (25 mg total) by mouth daily. 03/23/15  Yes Carmen Dohmeier, MD  ZECUITY 6.5 MG/4HR PTCH Place 6.5 mg onto the skin as needed (onset of migraine). Patient not taking: Reported on 05/11/2015 03/23/15   Larey Seat, MD   History   Social History  . Marital Status: Single    Spouse Name: N/A  . Number of Children: N/A  . Years of Education: N/A   Occupational History  . Not on file.   Social History Main Topics  . Smoking status: Current Every Day Smoker -- 0.50 packs/day    Types: Cigarettes  . Smokeless tobacco: Not on file  . Alcohol Use: Yes  Comment: occasionally  . Drug Use: No  . Sexual Activity: Not on file   Other Topics Concern  . Not on file   Social History Narrative   Drinks 2-3 cups of caffeine.    Review of Systems  Constitutional: Positive for diaphoresis.  Respiratory: Positive for shortness of breath.   Cardiovascular: Positive for chest pain and palpitations.  Gastrointestinal: Negative for nausea and vomiting.  Neurological: Positive for light-headedness and headaches. Negative for weakness and numbness.  Psychiatric/Behavioral: The patient is not nervous/anxious.        Objective:   Physical Exam  Constitutional: He is oriented to person, place, and time. He appears well-developed and well-nourished. No distress.    HENT:  Head: Normocephalic and atraumatic.  Eyes: Conjunctivae and EOM are normal.  Neck: Neck supple. No tracheal deviation present.  Cardiovascular: Normal rate, regular rhythm and normal heart sounds.   Pulmonary/Chest: Effort normal and breath sounds normal. No respiratory distress. He has no decreased breath sounds. He has no wheezes. He has no rhonchi. He has no rales.  Musculoskeletal: Normal range of motion.  Neurological: He is alert and oriented to person, place, and time.  Skin: Skin is warm and dry.  Psychiatric: He has a normal mood and affect. His behavior is normal.  Nursing note and vitals reviewed.  Filed Vitals:   05/11/15 1914  BP: 137/85  Pulse: 86  Temp: 98.6 F (37 C)  TempSrc: Oral  Resp: 18  Height: 5\' 8"  (1.727 m)  Weight: 245 lb 12.8 oz (111.494 kg)  SpO2: 99%   EKG reading by Dr. Carlota Raspberry Initial EKG had short PR interval of 116 but sinus rhythm with possible St elevation in Lead II, T wave inversion in Lead III and low voltage in precordial leads.   Repeat EKG with persistent borderline ST elevation in Lead II, T wave inversion in Lead III, possibly early repol V2, V3 but normal PR interval of 138. Compared to May 2016, noted to have early repolarization and slight T wave inversion at that time as well.   EKG rhythm strip rate 76, no ectopic beats    Results for orders placed or performed in visit on 05/11/15  POCT CBC  Result Value Ref Range   WBC 10.1 4.6 - 10.2 K/uL   Lymph, poc 2.3 0.6 - 3.4   POC LYMPH PERCENT 23.0 10 - 50 %L   MID (cbc) 0.6 0 - 0.9   POC MID % 5.8 0 - 12 %M   POC Granulocyte 7.2 (A) 2 - 6.9   Granulocyte percent 71.2 37 - 80 %G   RBC 5.56 4.69 - 6.13 M/uL   Hemoglobin 15.5 14.1 - 18.1 g/dL   HCT, POC 46.3 43.5 - 53.7 %   MCV 83.2 80 - 97 fL   MCH, POC 27.9 27 - 31.2 pg   MCHC 33.5 31.8 - 35.4 g/dL   RDW, POC 12.8 %   Platelet Count, POC 260 142 - 424 K/uL   MPV 7.8 0 - 99.8 fL   [facility] reading (PRIMARY) by  Dr.  Carlota Raspberry CXR: incomplete inspiration but no acute findings otherwise    Assessment & Plan:  [name] is a 24 y.o. male Palpitations - Plan: EKG 12-Lead, TSH, Sedimentation Rate, DG Chest 2 View, Ambulatory referral to Cardiology  Chest pain, unspecified chest pain type - Plan: EKG 12-Lead, POCT CBC, Sedimentation Rate, DG Chest 2 View, Ambulatory referral to Cardiology  Arm numbness - Plan: Ambulatory referral to  Cardiology  Lightheadedness - Plan: EKG 12-Lead, POCT CBC, Ambulatory referral to Cardiology  Episode of palpitations and associated chest pressure at rest with palpitations lasting approximately 45 minutes, with some associated anxiousness, lightheadedness, diaphoresis and dyspnea, and bilateral arm numbness/tingling without focal weakness. Palpitations subsided along with his shortness of breath, and lightheadedness.  Some slight residual left upper chest pressure sensation. Denies prior history of anxiety or panic attacks, or known recent stressors.  -EKG with some possible early repolarization, no apparent acute findings when compared to previous EKG in May.  -CBC overall normal, no acute findings on chest x-Collins.  -Sedimentation rate and TSH pending, but most recent TSH 2 months ago was normal.  -Without known risk factors for cardiac disease, less likely coronary artery disease.  Possible SVT or other arrhythmia, but not seen on EKG in office. Discussed monitoring in the emergency room to see if arrhythmia recurs and possible cardiac enzymes. Based on how he is feeling currently, he chose to monitor his symptoms at home and if recurrence of palpitations or any worsening of symptoms plans to call 911 or go to emergency room.   -Will refer to cardiology tomorrow for possible outpatient echo, and Holter or event monitor if needed.  -ER/911 precautions discussed for overnight.  Understanding expressed.  No orders of the defined types were placed in this encounter.   Patient  Instructions  As discussed, your EKG although not completely normal appears similar to ones you have the past. I will check a inflammation test, thyroid tests although this was normal a few months ago.  I do not see an abnormal heart rhythm on the testing here in the office, but if any recurrence of palpitations or fast heart rate or any change or worsening in your chest symptoms, call 911 or go to emergency room immediately. We will try to get you to a cardiologist tomorrow for further evaluation of these symptoms but again if any worsening or recurrence of symptoms tonight, go to the emergency room.  Palpitations A palpitation is the feeling that your heartbeat is irregular or is faster than normal. It may feel like your heart is fluttering or skipping a beat. Palpitations are usually not a serious problem. However, in some cases, you may need further medical evaluation. CAUSES  Palpitations can be caused by:  Smoking.  Caffeine or other stimulants, such as diet pills or energy drinks.  Alcohol.  Stress and anxiety.  Strenuous physical activity.  Fatigue.  Certain medicines.  Heart disease, especially if you have a history of irregular heart rhythms (arrhythmias), such as atrial fibrillation, atrial flutter, or supraventricular tachycardia.  An improperly working pacemaker or defibrillator. DIAGNOSIS  To find the cause of your palpitations, your health care provider will take your medical history and perform a physical exam. Your health care provider may also have you take a test called an ambulatory electrocardiogram (ECG). An ECG records your heartbeat patterns over a 24-hour period. You may also have other tests, such as:  Transthoracic echocardiogram (TTE). During echocardiography, sound waves are used to evaluate how blood flows through your heart.  Transesophageal echocardiogram (TEE).  Cardiac monitoring. This allows your health care provider to monitor your heart rate and  rhythm in real time.  Holter monitor. This is a portable device that records your heartbeat and can help diagnose heart arrhythmias. It allows your health care provider to track your heart activity for several days, if needed.  Stress tests by exercise or by giving medicine that makes  the heart beat faster. TREATMENT  Treatment of palpitations depends on the cause of your symptoms and can vary greatly. Most cases of palpitations do not require any treatment other than time, relaxation, and monitoring your symptoms. Other causes, such as atrial fibrillation, atrial flutter, or supraventricular tachycardia, usually require further treatment. HOME CARE INSTRUCTIONS   Avoid:  Caffeinated coffee, tea, soft drinks, diet pills, and energy drinks.  Chocolate.  Alcohol.  Stop smoking if you smoke.  Reduce your stress and anxiety. Things that can help you relax include:  A method of controlling things in your body, such as your heartbeats, with your mind (biofeedback).  Yoga.  Meditation.  Physical activity such as swimming, jogging, or walking.  Get plenty of rest and sleep. SEEK MEDICAL CARE IF:   You continue to have a fast or irregular heartbeat beyond 24 hours.  Your palpitations occur more often. SEEK IMMEDIATE MEDICAL CARE IF:  You have chest pain or shortness of breath.  You have a severe headache.  You feel dizzy or you faint. MAKE SURE YOU:  Understand these instructions.  Will watch your condition.  Will get help right away if you are not doing well or get worse. Document Released: 09/30/2000 Document Revised: 10/08/2013 Document Reviewed: 12/02/2011 [facility] Patient Information 2015 [place], [place]. This information is not intended to replace advice given to you by your health care provider. Make sure you discuss any questions you have with your health care provider.    Chest Pain (Nonspecific) It is often hard to give a specific diagnosis for the cause of  chest pain. There is always a chance that your pain could be related to something serious, such as a heart attack or a blood clot in the lungs. You need to follow up with your health care provider for further evaluation. CAUSES   Heartburn.  Pneumonia or bronchitis.  Anxiety or stress.  Inflammation around your heart (pericarditis) or lung (pleuritis or pleurisy).  A blood clot in the lung.  A collapsed lung (pneumothorax). It can develop suddenly on its own (spontaneous pneumothorax) or from trauma to the chest.  Shingles infection (herpes zoster virus). The chest wall is composed of bones, muscles, and cartilage. Any of these can be the source of the pain.  The bones can be bruised by injury.  The muscles or cartilage can be strained by coughing or overwork.  The cartilage can be affected by inflammation and become sore (costochondritis). DIAGNOSIS  Lab tests or other studies may be needed to find the cause of your pain. Your health care provider may have you take a test called an ambulatory electrocardiogram (ECG). An ECG records your heartbeat patterns over a 24-hour period. You may also have other tests, such as:  Transthoracic echocardiogram (TTE). During echocardiography, sound waves are used to evaluate how blood flows through your heart.  Transesophageal echocardiogram (TEE).  Cardiac monitoring. This allows your health care provider to monitor your heart rate and rhythm in real time.  Holter monitor. This is a portable device that records your heartbeat and can help diagnose heart arrhythmias. It allows your health care provider to track your heart activity for several days, if needed.  Stress tests by exercise or by giving medicine that makes the heart beat faster. TREATMENT   Treatment depends on what may be causing your chest pain. Treatment may include:  Acid blockers for heartburn.  Anti-inflammatory medicine.  Pain medicine for inflammatory  conditions.  Antibiotics if an infection is present.  You  may be advised to change lifestyle habits. This includes stopping smoking and avoiding alcohol, caffeine, and chocolate.  You may be advised to keep your head raised (elevated) when sleeping. This reduces the chance of acid going backward from your stomach into your esophagus. Most of the time, nonspecific chest pain will improve within 2-3 days with rest and mild pain medicine.  HOME CARE INSTRUCTIONS   If antibiotics were prescribed, take them as directed. Finish them even if you start to feel better.  For the next few days, avoid physical activities that bring on chest pain. Continue physical activities as directed.  Do not use any tobacco products, including cigarettes, chewing tobacco, or electronic cigarettes.  Avoid drinking alcohol.  Only take medicine as directed by your health care provider.  Follow your health care provider's suggestions for further testing if your chest pain does not go away.  Keep any follow-up appointments you made. If you do not go to an appointment, you could develop lasting (chronic) problems with pain. If there is any problem keeping an appointment, call to reschedule. SEEK MEDICAL CARE IF:   Your chest pain does not go away, even after treatment.  You have a rash with blisters on your chest.  You have a fever. SEEK IMMEDIATE MEDICAL CARE IF:   You have increased chest pain or pain that spreads to your arm, neck, jaw, back, or abdomen.  You have shortness of breath.  You have an increasing cough, or you cough up blood.  You have severe back or abdominal pain.  You feel nauseous or vomit.  You have severe weakness.  You faint.  You have chills. This is an emergency. Do not wait to see if the pain will go away. Get medical help at once. Call your local emergency services (911 in [place] Do not drive yourself to the hospital. MAKE SURE YOU:   Understand these  instructions.  Will watch your condition.  Will get help right away if you are not doing well or get worse. Document Released: 07/13/2005 Document Revised: 10/08/2013 Document Reviewed: 05/08/2008 [facility] Patient Information 2015 [place], [place]. This information is not intended to replace advice given to you by your health care provider. Make sure you discuss any questions you have with your health care provider.      I personally performed the services described in this documentation, which was scribed in my presence. The recorded information has been reviewed and considered, and addended by me as needed.

## 2015-05-11 NOTE — Patient Instructions (Signed)
As discussed, your EKG although not completely normal appears similar to ones you have the past. I will check a inflammation test, thyroid tests although this was normal a few months ago.  I do not see an abnormal heart rhythm on the testing here in the office, but if any recurrence of palpitations or fast heart rate or any change or worsening in your chest symptoms, call 911 or go to emergency room immediately. We will try to get you to a cardiologist tomorrow for further evaluation of these symptoms but again if any worsening or recurrence of symptoms tonight, go to the emergency room.  Palpitations A palpitation is the feeling that your heartbeat is irregular or is faster than normal. It may feel like your heart is fluttering or skipping a beat. Palpitations are usually not a serious problem. However, in some cases, you may need further medical evaluation. CAUSES  Palpitations can be caused by:  Smoking.  Caffeine or other stimulants, such as diet pills or energy drinks.  Alcohol.  Stress and anxiety.  Strenuous physical activity.  Fatigue.  Certain medicines.  Heart disease, especially if you have a history of irregular heart rhythms (arrhythmias), such as atrial fibrillation, atrial flutter, or supraventricular tachycardia.  An improperly working pacemaker or defibrillator. DIAGNOSIS  To find the cause of your palpitations, your health care provider will take your medical history and perform a physical exam. Your health care provider may also have you take a test called an ambulatory electrocardiogram (ECG). An ECG records your heartbeat patterns over a 24-hour period. You may also have other tests, such as:  Transthoracic echocardiogram (TTE). During echocardiography, sound waves are used to evaluate how blood flows through your heart.  Transesophageal echocardiogram (TEE).  Cardiac monitoring. This allows your health care provider to monitor your heart rate and rhythm in real  time.  Holter monitor. This is a portable device that records your heartbeat and can help diagnose heart arrhythmias. It allows your health care provider to track your heart activity for several days, if needed.  Stress tests by exercise or by giving medicine that makes the heart beat faster. TREATMENT  Treatment of palpitations depends on the cause of your symptoms and can vary greatly. Most cases of palpitations do not require any treatment other than time, relaxation, and monitoring your symptoms. Other causes, such as atrial fibrillation, atrial flutter, or supraventricular tachycardia, usually require further treatment. HOME CARE INSTRUCTIONS   Avoid:  Caffeinated coffee, tea, soft drinks, diet pills, and energy drinks.  Chocolate.  Alcohol.  Stop smoking if you smoke.  Reduce your stress and anxiety. Things that can help you relax include:  A method of controlling things in your body, such as your heartbeats, with your mind (biofeedback).  Yoga.  Meditation.  Physical activity such as swimming, jogging, or walking.  Get plenty of rest and sleep. SEEK MEDICAL CARE IF:   You continue to have a fast or irregular heartbeat beyond 24 hours.  Your palpitations occur more often. SEEK IMMEDIATE MEDICAL CARE IF:  You have chest pain or shortness of breath.  You have a severe headache.  You feel dizzy or you faint. MAKE SURE YOU:  Understand these instructions.  Will watch your condition.  Will get help right away if you are not doing well or get worse. Document Released: 09/30/2000 Document Revised: 10/08/2013 Document Reviewed: 12/02/2011 [facility] Patient Information 2015 [place], [place]. This information is not intended to replace advice given to you by your health care provider.  Make sure you discuss any questions you have with your health care provider.    Chest Pain (Nonspecific) It is often hard to give a specific diagnosis for the cause of chest pain. There  is always a chance that your pain could be related to something serious, such as a heart attack or a blood clot in the lungs. You need to follow up with your health care provider for further evaluation. CAUSES   Heartburn.  Pneumonia or bronchitis.  Anxiety or stress.  Inflammation around your heart (pericarditis) or lung (pleuritis or pleurisy).  A blood clot in the lung.  A collapsed lung (pneumothorax). It can develop suddenly on its own (spontaneous pneumothorax) or from trauma to the chest.  Shingles infection (herpes zoster virus). The chest wall is composed of bones, muscles, and cartilage. Any of these can be the source of the pain.  The bones can be bruised by injury.  The muscles or cartilage can be strained by coughing or overwork.  The cartilage can be affected by inflammation and become sore (costochondritis). DIAGNOSIS  Lab tests or other studies may be needed to find the cause of your pain. Your health care provider may have you take a test called an ambulatory electrocardiogram (ECG). An ECG records your heartbeat patterns over a 24-hour period. You may also have other tests, such as:  Transthoracic echocardiogram (TTE). During echocardiography, sound waves are used to evaluate how blood flows through your heart.  Transesophageal echocardiogram (TEE).  Cardiac monitoring. This allows your health care provider to monitor your heart rate and rhythm in real time.  Holter monitor. This is a portable device that records your heartbeat and can help diagnose heart arrhythmias. It allows your health care provider to track your heart activity for several days, if needed.  Stress tests by exercise or by giving medicine that makes the heart beat faster. TREATMENT   Treatment depends on what may be causing your chest pain. Treatment may include:  Acid blockers for heartburn.  Anti-inflammatory medicine.  Pain medicine for inflammatory conditions.  Antibiotics if an  infection is present.  You may be advised to change lifestyle habits. This includes stopping smoking and avoiding alcohol, caffeine, and chocolate.  You may be advised to keep your head raised (elevated) when sleeping. This reduces the chance of acid going backward from your stomach into your esophagus. Most of the time, nonspecific chest pain will improve within 2-3 days with rest and mild pain medicine.  HOME CARE INSTRUCTIONS   If antibiotics were prescribed, take them as directed. Finish them even if you start to feel better.  For the next few days, avoid physical activities that bring on chest pain. Continue physical activities as directed.  Do not use any tobacco products, including cigarettes, chewing tobacco, or electronic cigarettes.  Avoid drinking alcohol.  Only take medicine as directed by your health care provider.  Follow your health care provider's suggestions for further testing if your chest pain does not go away.  Keep any follow-up appointments you made. If you do not go to an appointment, you could develop lasting (chronic) problems with pain. If there is any problem keeping an appointment, call to reschedule. SEEK MEDICAL CARE IF:   Your chest pain does not go away, even after treatment.  You have a rash with blisters on your chest.  You have a fever. SEEK IMMEDIATE MEDICAL CARE IF:   You have increased chest pain or pain that spreads to your arm, neck, jaw, back,  or abdomen.  You have shortness of breath.  You have an increasing cough, or you cough up blood.  You have severe back or abdominal pain.  You feel nauseous or vomit.  You have severe weakness.  You faint.  You have chills. This is an emergency. Do not wait to see if the pain will go away. Get medical help at once. Call your local emergency services (911 in [place] Do not drive yourself to the hospital. MAKE SURE YOU:   Understand these instructions.  Will watch your condition.  Will  get help right away if you are not doing well or get worse. Document Released: 07/13/2005 Document Revised: 10/08/2013 Document Reviewed: 05/08/2008 [facility] Patient Information 2015 [place], [place]. This information is not intended to replace advice given to you by your health care provider. Make sure you discuss any questions you have with your health care provider.

## 2015-05-12 ENCOUNTER — Other Ambulatory Visit: Payer: Self-pay

## 2015-05-12 ENCOUNTER — Ambulatory Visit (HOSPITAL_COMMUNITY): Payer: 59 | Attending: Cardiovascular Disease

## 2015-05-12 ENCOUNTER — Encounter: Payer: Self-pay | Admitting: Cardiology

## 2015-05-12 ENCOUNTER — Ambulatory Visit (INDEPENDENT_AMBULATORY_CARE_PROVIDER_SITE_OTHER): Payer: 59

## 2015-05-12 ENCOUNTER — Ambulatory Visit (INDEPENDENT_AMBULATORY_CARE_PROVIDER_SITE_OTHER): Payer: 59 | Admitting: Cardiology

## 2015-05-12 VITALS — BP 130/72 | HR 96 | Ht 69.0 in | Wt 246.8 lb

## 2015-05-12 DIAGNOSIS — G4733 Obstructive sleep apnea (adult) (pediatric): Secondary | ICD-10-CM | POA: Insufficient documentation

## 2015-05-12 DIAGNOSIS — R079 Chest pain, unspecified: Secondary | ICD-10-CM | POA: Insufficient documentation

## 2015-05-12 DIAGNOSIS — R002 Palpitations: Secondary | ICD-10-CM | POA: Insufficient documentation

## 2015-05-12 DIAGNOSIS — R0789 Other chest pain: Secondary | ICD-10-CM

## 2015-05-12 DIAGNOSIS — R011 Cardiac murmur, unspecified: Secondary | ICD-10-CM | POA: Diagnosis not present

## 2015-05-12 LAB — SEDIMENTATION RATE: SED RATE: 4 mm/h (ref 0–15)

## 2015-05-12 LAB — TSH: TSH: 1.079 u[IU]/mL (ref 0.350–4.500)

## 2015-05-12 NOTE — Patient Instructions (Signed)
Medication Instructions:  Your physician recommends that you continue on your current medications as directed. Please refer to the Current Medication list given to you today.  Labwork: none  Testing/Procedures: Your physician has requested that you have an echocardiogram. Echocardiography is a painless test that uses sound waves to create images of your heart. It provides your doctor with information about the size and shape of your heart and how well your heart's chambers and valves are working. This procedure takes approximately one hour. There are no restrictions for this procedure.  Your physician has recommended that you wear an event monitor. Event monitors are medical devices that record the heart's electrical activity. Doctors most often [place] these monitors to diagnose arrhythmias. Arrhythmias are problems with the speed or rhythm of the heartbeat. The monitor is a small, portable device. You can wear one while you do your normal daily activities. This is usually used to diagnose what is causing palpitations/syncope (passing out).  Follow-Up: As needed

## 2015-05-12 NOTE — Progress Notes (Signed)
Cardiology Office Note   Date:  05/12/2015   ID:  Arthur Collins, DOB [birth year], MRN [id number]  PCP:  No PCP Per Patient  Cardiologist: Darlin Coco MD  No chief complaint on file.     History of Present Illness: Arthur Collins is a 24 y.o. male who presents for outpatient evaluation of recent chest pain and palpitations.  He is seen at the request of [facility]  The patient presented there yesterday with a history of fast heart rate which began about 6 PM last evening.  It was associated with diaphoresis and some left anterior chest discomfort.  The patient does not have any prior history of chest pain.  He does state that he has had a heart murmur since childhood.  He does not have any history of hypercholesterolemia or diabetes.  He has had a history of some intermittent palpitations.  These do not occur on a daily basis.  The patient has not had any history of dizziness or syncope.  He does not give any history of exertional chest pain.  This pain last night occurred at rest.  He was seen at the urgent care center where his EKG showed changes of benign early repolarization.  Physical examination was unremarkable.  Lab work was obtained including a sedimentation rate which was normal. His family history is negative for premature coronary disease.  His father is living at age 67 and is healthy.  His mother is living at age 36 and has high cholesterol high blood pressure and diabetes.    Past Medical History  Diagnosis Date  . GERD (gastroesophageal reflux disease)   . Frequent headaches   . OSA on CPAP 03/11/2015    Dr Lanny Hurst clance.     Past Surgical History  Procedure Laterality Date  . Orif humerus fracture    . Fracture surgery       Current Outpatient Prescriptions  Medication Sig Dispense Refill  . meclizine (ANTIVERT) 25 MG tablet Take 25 mg by mouth daily as needed for dizziness.    . ondansetron (ZOFRAN-ODT) 4 MG  disintegrating tablet Take 4 mg by mouth every 8 (eight) hours as needed. (NAUSEA AND VOMITING)  0   No current facility-administered medications for this visit.    Allergies:   Review of patient's allergies indicates no known allergies.    Social History:  The patient  reports that he has been smoking Cigarettes.  He has been smoking about 0.50 packs per day. He has never used smokeless tobacco. He reports that he drinks alcohol. He reports that he does not use illicit drugs.   Family History:  The patient's family history includes Diabetes in his mother; Epilepsy in his brother; Healthy in his brother, brother, brother, brother, and maternal grandmother; Hypertension in his mother; Other in his father.    ROS:  Please see the history of present illness.   Otherwise, review of systems are positive for none.   All other systems are reviewed and negative.    PHYSICAL EXAM: VS:  BP 130/72 mmHg  Pulse 96  Ht 5\' 9"  (1.753 m)  Wt 246 lb 12.8 oz (111.948 kg)  BMI 36.43 kg/m2  SpO2 98% , BMI Body mass index is 36.43 kg/(m^2). GEN: Well nourished, well developed, in no acute distress HEENT: normal Neck: no JVD, carotid bruits, or masses Cardiac: RRR; grade 2/6 systolic ejection murmur at the base without radiation.  No diastolic murmur.  No gallop.  No abnormal lift or heave. Respiratory:  clear to auscultation bilaterally, normal work of breathing GI: soft, nontender, nondistended, + BS MS: no deformity or atrophy Skin: warm and dry, no rash Neuro:  Strength and sensation are intact Psych: euthymic mood, full affect   EKG:  EKG is ordered today. The ekg ordered today demonstrates normal sinus rhythm.  ST elevation due to early repolarization, unchanged from prior tracings.  He had a previous EKG in May 2016 which showed similar findings.   Recent Labs: 02/27/2015: ALT 39; BUN 13; Creatinine, Ser 0.73; Platelets 257; Potassium 3.5; Sodium 138 05/11/2015: Hemoglobin 15.5; TSH 1.079     Lipid Panel No results found for: CHOL, TRIG, HDL, CHOLHDL, VLDL, LDLCALC, LDLDIRECT    Wt Readings from Last 3 Encounters:  05/12/15 246 lb 12.8 oz (111.948 kg)  05/11/15 245 lb 12.8 oz (111.494 kg)  03/18/15 245 lb (111.131 kg)       ASSESSMENT AND PLAN:  1.  Atypical chest pain.  Low likelihood for ischemic heart disease 2.  Systolic ejection murmur at the base.  Rule out cardiomyopathy.  Rule out aortic valve disease 3.  Recent onset of tachypalpitations, intermittent   Current medicines are reviewed at length with the patient today.  The patient does not have concerns regarding medicines.  The following changes have been made:  no change  Labs/ tests ordered today include:  Orders Placed This Encounter  Procedures  . Cardiac event monitor  . EKG 12-Lead  . ECHOCARDIOGRAM COMPLETE    Disposition: We will arrange for an echocardiogram.  We will also place an event monitor to evaluate his symptoms of tachycardia palpitations.  No new medications prescribed today. Many thanks for the opportunity to see this pleasant gentleman with you.  We will be in touch regarding the results of his studies.  The patient may continue normal activity including working as a Quarry manager at [employer], Darlin Coco MD 05/12/2015 6:39 PM    [address], [place], [place]  [id number] Phone: [phone]; Fax: [phone]

## 2015-06-01 ENCOUNTER — Telehealth: Payer: Self-pay | Admitting: Neurology

## 2015-06-01 ENCOUNTER — Ambulatory Visit (INDEPENDENT_AMBULATORY_CARE_PROVIDER_SITE_OTHER): Payer: 59 | Admitting: Family Medicine

## 2015-06-01 ENCOUNTER — Telehealth: Payer: Self-pay | Admitting: Family Medicine

## 2015-06-01 VITALS — BP 124/72 | HR 82 | Temp 98.9°F | Resp 18 | Ht 68.0 in | Wt 245.0 lb

## 2015-06-01 DIAGNOSIS — R0789 Other chest pain: Secondary | ICD-10-CM | POA: Diagnosis not present

## 2015-06-01 DIAGNOSIS — E669 Obesity, unspecified: Secondary | ICD-10-CM

## 2015-06-01 DIAGNOSIS — K219 Gastro-esophageal reflux disease without esophagitis: Secondary | ICD-10-CM | POA: Insufficient documentation

## 2015-06-01 DIAGNOSIS — R0600 Dyspnea, unspecified: Secondary | ICD-10-CM | POA: Diagnosis not present

## 2015-06-01 LAB — LIPID PANEL
Cholesterol: 155 mg/dL (ref 125–200)
HDL: 39 mg/dL — ABNORMAL LOW (ref >=40)
LDL Cholesterol: 96 mg/dL (ref <130)
Total CHOL/HDL Ratio: 4 Ratio (ref <=5.0)
Triglycerides: 99 mg/dL (ref <150)
VLDL: 20 mg/dL (ref <30)

## 2015-06-01 LAB — POCT GLYCOSYLATED HEMOGLOBIN (HGB A1C): Hemoglobin A1C: 5.6

## 2015-06-01 LAB — COMPREHENSIVE METABOLIC PANEL
ALT: 28 U/L (ref 9–46)
AST: 15 U/L (ref 10–40)
Albumin: 4.5 g/dL (ref 3.6–5.1)
Alkaline Phosphatase: 117 U/L — ABNORMAL HIGH (ref 40–115)
BUN: 13 mg/dL (ref 7–25)
CALCIUM: 9.8 mg/dL (ref 8.6–10.3)
CO2: 27 mmol/L (ref 20–31)
CREATININE: 0.86 mg/dL (ref 0.60–1.35)
Chloride: 103 mmol/L (ref 98–110)
GLUCOSE: 95 mg/dL (ref 65–99)
Potassium: 4 mmol/L (ref 3.5–5.3)
SODIUM: 139 mmol/L (ref 135–146)
Total Bilirubin: 0.3 mg/dL (ref 0.2–1.2)
Total Protein: 7.7 g/dL (ref 6.1–8.1)

## 2015-06-01 MED ORDER — FAMOTIDINE 20 MG PO TABS: 20.0000 mg | ORAL_TABLET | Freq: Two times a day (BID) | ORAL | Status: DC

## 2015-06-01 MED ORDER — ESOMEPRAZOLE MAGNESIUM 20 MG PO PACK: 20.0000 mg | PACK | Freq: Every day | ORAL | Status: DC

## 2015-06-01 NOTE — Telephone Encounter (Signed)
Arthur Collins with [facility] inquiring if patient had sleep study that confirmed OSA. Please call him at [phone].

## 2015-06-01 NOTE — Telephone Encounter (Signed)
Spoke with Rosario Adie at [facility]. I advised him that pt has not had a sleep study here with [place]. Ronalee Belts, [place] states that pt does have osa and should be on cpap. I advised him that Dr. Brett Fairy saw the pt for vertigo and migraines and that pt would need a referral in for a sleep consult. Ronalee Belts, [place] said he would inform pt and get a referral in.

## 2015-06-01 NOTE — Telephone Encounter (Signed)
lmom to call and schedule an appt with Jaynee Eagles for a 1 month follow up for chest pain/gerd

## 2015-06-01 NOTE — Patient Instructions (Signed)
Food Choices for Gastroesophageal Reflux Disease When you have gastroesophageal reflux disease (GERD), the foods you eat and your eating habits are very important. Choosing the right foods can help ease the discomfort of GERD. WHAT GENERAL GUIDELINES DO I NEED TO FOLLOW?  Choose fruits, vegetables, whole grains, low-fat dairy products, and low-fat meat, fish, and poultry.  Limit fats such as oils, salad dressings, butter, nuts, and avocado.  Keep a food diary to identify foods that cause symptoms.  Avoid foods that cause reflux. These may be different for different people.  Eat frequent small meals instead of three large meals each day.  Eat your meals slowly, in a relaxed setting.  Limit fried foods.  Cook foods using methods other than frying.  Avoid drinking alcohol.  Avoid drinking large amounts of liquids with your meals.  Avoid bending over or lying down until 2-3 hours after eating. WHAT FOODS ARE NOT RECOMMENDED? The following are some foods and drinks that may worsen your symptoms: Vegetables Tomatoes. Tomato juice. Tomato and spaghetti sauce. Chili peppers. Onion and garlic. Horseradish. Fruits Oranges, grapefruit, and lemon (fruit and juice). Meats High-fat meats, fish, and poultry. This includes hot dogs, ribs, ham, sausage, salami, and bacon. Dairy Whole milk and chocolate milk. Sour cream. Cream. Butter. Ice cream. Cream cheese.  Beverages Coffee and tea, with or without caffeine. Carbonated beverages or energy drinks. Condiments Hot sauce. Barbecue sauce.  Sweets/Desserts Chocolate and cocoa. Donuts. Peppermint and spearmint. Fats and Oils High-fat foods, including [place] fries and potato chips. Other Vinegar. Strong spices, such as black pepper, white pepper, red pepper, cayenne, curry powder, cloves, ginger, and chili powder. The items listed above may not be a complete list of foods and beverages to avoid. Contact your dietitian for more  information. Document Released: 10/03/2005 Document Revised: 10/08/2013 Document Reviewed: 08/07/2013 [facility] Patient Information 2015 [place], [place]. This information is not intended to replace advice given to you by your health care provider. Make sure you discuss any questions you have with your health care provider.   Chest Pain (Nonspecific) It is often hard to give a specific diagnosis for the cause of chest pain. There is always a chance that your pain could be related to something serious, such as a heart attack or a blood clot in the lungs. You need to follow up with your health care provider for further evaluation. CAUSES   Heartburn.  Pneumonia or bronchitis.  Anxiety or stress.  Inflammation around your heart (pericarditis) or lung (pleuritis or pleurisy).  A blood clot in the lung.  A collapsed lung (pneumothorax). It can develop suddenly on its own (spontaneous pneumothorax) or from trauma to the chest.  Shingles infection (herpes zoster virus). The chest wall is composed of bones, muscles, and cartilage. Any of these can be the source of the pain.  The bones can be bruised by injury.  The muscles or cartilage can be strained by coughing or overwork.  The cartilage can be affected by inflammation and become sore (costochondritis). DIAGNOSIS  Lab tests or other studies may be needed to find the cause of your pain. Your health care provider may have you take a test called an ambulatory electrocardiogram (ECG). An ECG records your heartbeat patterns over a 24-hour period. You may also have other tests, such as:  Transthoracic echocardiogram (TTE). During echocardiography, sound waves are used to evaluate how blood flows through your heart.  Transesophageal echocardiogram (TEE).  Cardiac monitoring. This allows your health care provider to monitor your heart  rate and rhythm in real time.  Holter monitor. This is a portable device that records your heartbeat and can  help diagnose heart arrhythmias. It allows your health care provider to track your heart activity for several days, if needed.  Stress tests by exercise or by giving medicine that makes the heart beat faster. TREATMENT   Treatment depends on what may be causing your chest pain. Treatment may include:  Acid blockers for heartburn.  Anti-inflammatory medicine.  Pain medicine for inflammatory conditions.  Antibiotics if an infection is present.  You may be advised to change lifestyle habits. This includes stopping smoking and avoiding alcohol, caffeine, and chocolate.  You may be advised to keep your head raised (elevated) when sleeping. This reduces the chance of acid going backward from your stomach into your esophagus. Most of the time, nonspecific chest pain will improve within 2-3 days with rest and mild pain medicine.  HOME CARE INSTRUCTIONS   If antibiotics were prescribed, take them as directed. Finish them even if you start to feel better.  For the next few days, avoid physical activities that bring on chest pain. Continue physical activities as directed.  Do not use any tobacco products, including cigarettes, chewing tobacco, or electronic cigarettes.  Avoid drinking alcohol.  Only take medicine as directed by your health care provider.  Follow your health care provider's suggestions for further testing if your chest pain does not go away.  Keep any follow-up appointments you made. If you do not go to an appointment, you could develop lasting (chronic) problems with pain. If there is any problem keeping an appointment, call to reschedule. SEEK MEDICAL CARE IF:   Your chest pain does not go away, even after treatment.  You have a rash with blisters on your chest.  You have a fever. SEEK IMMEDIATE MEDICAL CARE IF:   You have increased chest pain or pain that spreads to your arm, neck, jaw, back, or abdomen.  You have shortness of breath.  You have an increasing  cough, or you cough up blood.  You have severe back or abdominal pain.  You feel nauseous or vomit.  You have severe weakness.  You faint.  You have chills. This is an emergency. Do not wait to see if the pain will go away. Get medical help at once. Call your local emergency services (911 in [place] Do not drive yourself to the hospital. MAKE SURE YOU:   Understand these instructions.  Will watch your condition.  Will get help right away if you are not doing well or get worse. Document Released: 07/13/2005 Document Revised: 10/08/2013 Document Reviewed: 05/08/2008 [facility] Patient Information 2015 [place], [place]. This information is not intended to replace advice given to you by your health care provider. Make sure you discuss any questions you have with your health care provider.

## 2015-06-01 NOTE — Progress Notes (Signed)
MRN: [id number] DOB: Sep 09, 1991  Subjective:   Arthur Collins is a 24 y.o. male presenting for chief complaint of Chest Pain  Reports 3-4 week history intermittent mid-left sided chest pain. His chest pain has not changed in nature, not worse. Reports that his chest pain has been associated with eating. He also reports that he has palpitations, heart racing, pnd, snores loudly in his sleep. He has been seen here, [facility], for the same on 05/11/2015. Referred to cardiology and seen by Dr. Mare Ferrari on 05/12/2015, Holter monitor was placed but patient admits non-compliance with this. Has not scheduled follow up with Cardiology. Smokes 1/2ppd and has been cutting back. Admits diet is unhealthy but has been trying to make changes here. Has been seen by Dr. Brett Fairy at a sleep center for different cc and therefore, no sleep study has been done. No f/u scheduled with Dr. Brett Fairy. Of note, patient works 3rd shift and also has a part-time job at [employer] which definitely interferes with his sleep cycle. Fortunately, patient is switching to 1st shift in the next couple of weeks. Denies any other aggravating or relieving factors, no other questions or concerns.  Arthur Collins has a current medication list which includes the following prescription(s): meclizine and ondansetron. Also has No Known Allergies.  Arthur Collins  has a past medical history of GERD (gastroesophageal reflux disease); Frequent headaches; and OSA on CPAP (03/11/2015). Also  has past surgical history that includes ORIF humerus fracture and Fracture surgery.  Objective:   Vitals: BP 124/72 mmHg  Pulse 82  Temp(Src) 98.9 F (37.2 C) (Oral)  Resp 18  Ht 5\' 8"  (1.727 m)  Wt 245 lb (111.131 kg)  BMI 37.26 kg/m2  SpO2 98%  Physical Exam  Constitutional: He is oriented to person, place, and time. He appears well-developed and well-nourished.  HENT:  Mouth/Throat: Oropharynx is clear and moist.  Eyes: Conjunctivae are  normal. Right eye exhibits no discharge. Left eye exhibits no discharge. No scleral icterus.  Cardiovascular: Normal rate, regular rhythm and intact distal pulses.  Exam reveals no gallop and no friction rub.   No murmur heard. Pulmonary/Chest: No respiratory distress. He has no wheezes. He has no rales. He exhibits tenderness (with palpation over mid to lower sternum to left side).  Abdominal: Soft. Bowel sounds are normal. He exhibits no distension and no mass. There is no tenderness.  Neurological: He is alert and oriented to person, place, and time.  Skin: Skin is warm and dry. No rash noted. No erythema. No pallor.   ECG interpretation by Dr. Marin Comment - normal sinus rhythm with non-specific ST changes, unchanged from ECG on 05/11/2015.  Assessment and Plan :   1. Atypical chest pain - Unclear etiology, there may be multiple contributing factors including his work schedule, lack of appropriate rest, diet and possibly sleep apnea.  - Recommended patient get in touch with his cardiologist regarding his chest pain, holter monitor. Patient stated he would contact them soon. - I will refer for sleep study to rule out sleep apnea as the major source for his symptoms. - EKG 12-Lead - Lipid panel - Comprehensive metabolic panel - POCT glycosylated hemoglobin (Hb A1C) - esomeprazole (NEXIUM) 20 MG packet; Take 20 mg by mouth daily before breakfast.  Dispense: 30 each; Refill: 12 - famotidine (PEPCID) 20 MG tablet; Take 1 tablet (20 mg total) by mouth 2 (two) times daily.  Dispense: 60 tablet; Refill: 1 - Ambulatory referral to Sleep Studies  2. Obesity - Continue efforts with weight loss. - Lipid panel - POCT glycosylated hemoglobin (Hb A1C) - Ambulatory referral to Sleep Studies  3. PND (paroxysmal nocturnal dyspnea) - As above. - Ambulatory referral to Sleep Studies  4. Gastroesophageal reflux disease, esophagitis presence not specified - Will start management for GERD, dietary  modifications recommended. - F/u with me in 4 weeks. - esomeprazole (NEXIUM) 20 MG packet; Take 20 mg by mouth daily before breakfast.  Dispense: 30 each; Refill: 12 - famotidine (PEPCID) 20 MG tablet; Take 1 tablet (20 mg total) by mouth 2 (two) times daily.  Dispense: 60 tablet; Refill: [place], PA-C [facility] [phone] 06/01/2015 8:22 AM

## 2015-06-04 NOTE — Progress Notes (Signed)
Agree with A/P. Dr Gunhild Bautch 

## 2015-07-06 ENCOUNTER — Telehealth: Payer: Self-pay | Admitting: *Deleted

## 2015-07-06 ENCOUNTER — Telehealth: Payer: Self-pay

## 2015-07-06 NOTE — Telephone Encounter (Signed)
Pt is to have consultation re sleep study on wed,please advise([facility])    Best phone number is  [phone]

## 2015-07-06 NOTE — Telephone Encounter (Addendum)
I called and LMVM for pt to return call to reschedule appt from 07-08-15 to another time (30 min) due to Dr. Brett Fairy recovery.

## 2015-07-07 ENCOUNTER — Ambulatory Visit (INDEPENDENT_AMBULATORY_CARE_PROVIDER_SITE_OTHER): Payer: 59 | Admitting: Urgent Care

## 2015-07-07 VITALS — BP 136/78 | HR 87 | Temp 98.8°F | Resp 17 | Ht 68.0 in | Wt 244.0 lb

## 2015-07-07 DIAGNOSIS — R0683 Snoring: Secondary | ICD-10-CM | POA: Diagnosis not present

## 2015-07-07 DIAGNOSIS — E669 Obesity, unspecified: Secondary | ICD-10-CM | POA: Diagnosis not present

## 2015-07-07 DIAGNOSIS — R0789 Other chest pain: Secondary | ICD-10-CM | POA: Diagnosis not present

## 2015-07-07 DIAGNOSIS — R0600 Dyspnea, unspecified: Secondary | ICD-10-CM | POA: Diagnosis not present

## 2015-07-07 DIAGNOSIS — M940 Chondrocostal junction syndrome [Tietze]: Secondary | ICD-10-CM

## 2015-07-07 MED ORDER — MELOXICAM 15 MG PO TABS: 7.5000 mg | ORAL_TABLET | Freq: Every day | ORAL | Status: DC

## 2015-07-07 NOTE — Patient Instructions (Signed)
Sleep Apnea  Sleep apnea is a sleep disorder characterized by abnormal pauses in breathing while you sleep. When your breathing pauses, the level of oxygen in your blood decreases. This causes you to move out of deep sleep and into light sleep. As a result, your quality of sleep is poor, and the system that carries your blood throughout your body (cardiovascular system) experiences stress. If sleep apnea remains untreated, the following conditions can develop:  High blood pressure (hypertension).  Coronary artery disease.  Inability to achieve or maintain an erection (impotence).  Impairment of your thought process (cognitive dysfunction). There are three types of sleep apnea:  Obstructive sleep apnea--Pauses in breathing during sleep because of a blocked airway.  Central sleep apnea--Pauses in breathing during sleep because the area of the brain that controls your breathing does not send the correct signals to the muscles that control breathing.  Mixed sleep apnea--A combination of both obstructive and central sleep apnea. RISK FACTORS The following risk factors can increase your risk of developing sleep apnea:  Being overweight.  Smoking.  Having narrow passages in your nose and throat.  Being of older age.  Being male.  Alcohol use.  Sedative and tranquilizer use.  Ethnicity. Among individuals younger than 35 years, African Americans are at increased risk of sleep apnea. SYMPTOMS   Difficulty staying asleep.  Daytime sleepiness and fatigue.  Loss of energy.  Irritability.  Loud, heavy snoring.  Morning headaches.  Trouble concentrating.  Forgetfulness.  Decreased interest in sex. DIAGNOSIS  In order to diagnose sleep apnea, your caregiver will perform a physical examination. Your caregiver may suggest that you take a home sleep test. Your caregiver may also recommend that you spend the night in a sleep lab. In the sleep lab, several monitors record  information about your heart, lungs, and brain while you sleep. Your leg and arm movements and blood oxygen level are also recorded. TREATMENT The following actions may help to resolve mild sleep apnea:  Sleeping on your side.   Using a decongestant if you have nasal congestion.   Avoiding the use of depressants, including alcohol, sedatives, and narcotics.   Losing weight and modifying your diet if you are overweight. There also are devices and treatments to help open your airway:  Oral appliances. These are custom-made mouthpieces that shift your lower jaw forward and slightly open your bite. This opens your airway.  Devices that create positive airway pressure. This positive pressure "splints" your airway open to help you breathe better during sleep. The following devices create positive airway pressure:  Continuous positive airway pressure (CPAP) device. The CPAP device creates a continuous level of air pressure with an air pump. The air is delivered to your airway through a mask while you sleep. This continuous pressure keeps your airway open.  Nasal expiratory positive airway pressure (EPAP) device. The EPAP device creates positive air pressure as you exhale. The device consists of single-use valves, which are inserted into each nostril and held in place by adhesive. The valves create very little resistance when you inhale but create much more resistance when you exhale. That increased resistance creates the positive airway pressure. This positive pressure while you exhale keeps your airway open, making it easier to breath when you inhale again.  Bilevel positive airway pressure (BPAP) device. The BPAP device is used mainly in patients with central sleep apnea. This device is similar to the CPAP device because it also uses an air pump to deliver continuous air pressure  through a mask. However, with the BPAP machine, the pressure is set at two different levels. The pressure when you  exhale is lower than the pressure when you inhale.  Surgery. Typically, surgery is only done if you cannot comply with less invasive treatments or if the less invasive treatments do not improve your condition. Surgery involves removing excess tissue in your airway to create a wider passage way. Document Released: 09/23/2002 Document Revised: 01/28/2013 Document Reviewed: 02/09/2012 [facility] Patient Information 2015 [place], [place]. This information is not intended to replace advice given to you by your health care provider. Make sure you discuss any questions you have with your health care provider.    Costochondritis Costochondritis, sometimes called Tietze syndrome, is a swelling and irritation (inflammation) of the tissue (cartilage) that connects your ribs with your breastbone (sternum). It causes pain in the chest and rib area. Costochondritis usually goes away on its own over time. It can take up to 6 weeks or longer to get better, especially if you are unable to limit your activities. CAUSES  Some cases of costochondritis have no known cause. Possible causes include:  Injury (trauma).  Exercise or activity such as lifting.  Severe coughing. SIGNS AND SYMPTOMS  Pain and tenderness in the chest and rib area.  Pain that gets worse when coughing or taking deep breaths.  Pain that gets worse with specific movements. DIAGNOSIS  Your health care provider will do a physical exam and ask about your symptoms. Chest X-rays or other tests may be done to rule out other problems. TREATMENT  Costochondritis usually goes away on its own over time. Your health care provider may prescribe medicine to help relieve pain. HOME CARE INSTRUCTIONS   Avoid exhausting physical activity. Try not to strain your ribs during normal activity. This would include any activities using chest, abdominal, and side muscles, especially if heavy weights are used.  Apply ice to the affected area for the first 2 days  after the pain begins.  Put ice in a plastic bag.  Place a towel between your skin and the bag.  Leave the ice on for 20 minutes, 2-3 times a day.  Only take over-the-counter or prescription medicines as directed by your health care provider. SEEK MEDICAL CARE IF:  You have redness or swelling at the rib joints. These are signs of infection.  Your pain does not go away despite rest or medicine. SEEK IMMEDIATE MEDICAL CARE IF:   Your pain increases or you are very uncomfortable.  You have shortness of breath or difficulty breathing.  You cough up blood.  You have worse chest pains, sweating, or vomiting.  You have a fever or persistent symptoms for more than 2-3 days.  You have a fever and your symptoms suddenly get worse. MAKE SURE YOU:   Understand these instructions.  Will watch your condition.  Will get help right away if you are not doing well or get worse. Document Released: 07/13/2005 Document Revised: 07/24/2013 Document Reviewed: 05/07/2013 [facility] Patient Information 2015 [place], [place]. This information is not intended to replace advice given to you by your health care provider. Make sure you discuss any questions you have with your health care provider.

## 2015-07-07 NOTE — Progress Notes (Signed)
MRN: [id number] DOB: November 22, 1990  Subjective:   Arthur Collins is a 24 y.o. male presenting for chief complaint of Chest Pain; Headache; Back Pain; and Emesis  Patient is presenting for followup on her multiple complaints. His primary concern is unchanged mid-sternal chest pain. This is in an ongoing issue for multiple months. Has been associated with paroxysmal nocturnal dyspnea, daytime sleepiness, fatigue, heart racing and palpitations. He has been seen by cardiology with Dr. Mare Ferrari, by neurology with Dr. Brett Fairy. He has had compliance issues with them and has been inconsistent with followup. Patient states that he has not liked the way that his studies have been handled by those practices. However, he is willing to continue following up with Dr. Mare Ferrari but would like to be sent to a different sleep center. His sleep study was scheduled for November and I agree with patient that this needs to happen much sooner.  Patient also reports that he continues to take Nexium to address reflux symptoms given negative cardiac workup up to now. Additionally, in the last month, he has had intermittent left-sided rib cage pain just under his left axilla, worse with movement. He has not tried medicines for this. Denies trauma, bruising. Denies any other aggravating or relieving factors, no other questions or concerns.  Arthur Collins has a current medication list which includes the following prescription(s): esomeprazole. Also has No Known Allergies.  Arthur Collins  has a past medical history of GERD (gastroesophageal reflux disease); Frequent headaches; and OSA on CPAP (03/11/2015). Also  has past surgical history that includes ORIF humerus fracture and Fracture surgery.  Objective:   Vitals: BP 136/78 mmHg  Pulse 87  Temp(Src) 98.8 F (37.1 C) (Oral)  Resp 17  Ht 5\' 8"  (1.727 m)  Wt 244 lb (110.678 kg)  BMI 37.11 kg/m2  SpO2 99%  Wt Readings from Last 3 Encounters:  07/07/15 244 lb (110.678 kg)    06/01/15 245 lb (111.131 kg)  05/12/15 246 lb 12.8 oz (111.948 kg)   Physical Exam  Constitutional: He is oriented to person, place, and time. He appears well-developed and well-nourished.  HENT:  Mouth/Throat: Oropharynx is clear and moist.  Eyes: EOM are normal. Pupils are equal, round, and reactive to light. No scleral icterus.  Cardiovascular: Normal rate, regular rhythm and intact distal pulses.  Exam reveals no gallop and no friction rub.   No murmur heard. Pulmonary/Chest: No respiratory distress. He has no wheezes. He has no rales.  Musculoskeletal:  Patient has tenderness over ribs and intercostal spaces at level of 4th-5th left lateral ribs. No masses appreciated, no ecchymosis, swelling or erythema, bony deformity, lacerations.  Neurological: He is alert and oriented to person, place, and time.  Skin: Skin is warm and dry. No erythema. No pallor.   Assessment and Plan :   1. Atypical chest pain 2. PND (paroxysmal nocturnal dyspnea) 3. Obesity 4. Snoring - Unclear etiology. Plan patient scheduled for a sleep study soon is important so I will request referral to be changed. Patient is to continue Nexium. He has not made significant dietary changes, states that he eats hospital food. He is not exercising. Patient did tell me that he is getting a consultation for LAP-BAND surgery. This may be an option for the patient since he is not willing to manage his weight better. I suspect that the majority of his symptoms could be explained by undiagnosed sleep apnea. I counseled patient on this diagnosis and he agreed to continue the workup for this.  5. Costochondritis - Will start conservative initial costochondritis with meloxicam once a day. Patient is to recheck with me in 2 weeks if this has not improved.  Jaynee Eagles, PA-C [facility] [phone] 07/07/2015 6:52 PM

## 2015-07-07 NOTE — Telephone Encounter (Signed)
Seen in clinic today.

## 2015-07-08 ENCOUNTER — Institutional Professional Consult (permissible substitution): Payer: 59 | Admitting: Neurology

## 2015-07-08 ENCOUNTER — Telehealth: Payer: Self-pay

## 2015-07-08 DIAGNOSIS — R0600 Dyspnea, unspecified: Secondary | ICD-10-CM

## 2015-07-08 DIAGNOSIS — R0789 Other chest pain: Secondary | ICD-10-CM

## 2015-07-08 DIAGNOSIS — E669 Obesity, unspecified: Secondary | ICD-10-CM

## 2015-07-08 NOTE — Telephone Encounter (Signed)
The Sleep Study order needs to be changed to a referral, since they will need to be seen by a provider at the facility and that facility will submit an order.

## 2015-07-08 NOTE — Telephone Encounter (Signed)
Done

## 2015-07-15 NOTE — Telephone Encounter (Signed)
Pt scheduled 07-23-15 with Dr. Brett Fairy.

## 2015-07-20 ENCOUNTER — Telehealth: Payer: Self-pay

## 2015-07-20 NOTE — Telephone Encounter (Signed)
Spoke to pt. He is aware that his 10/6 appt needs to be rescheduled. Pt is going to call us back when he gets his work schedule to get the appt r/s. He needs to be seen in 07/2015.   Please schedule pt for a sleep consult, 30 mins. If not able to get in October, please send me a message.

## 2015-07-22 ENCOUNTER — Ambulatory Visit (INDEPENDENT_AMBULATORY_CARE_PROVIDER_SITE_OTHER): Payer: 59 | Admitting: Family Medicine

## 2015-07-22 VITALS — BP 130/70 | HR 67 | Temp 98.1°F | Resp 18 | Ht 68.0 in | Wt 246.0 lb

## 2015-07-22 DIAGNOSIS — R5383 Other fatigue: Secondary | ICD-10-CM | POA: Diagnosis not present

## 2015-07-22 DIAGNOSIS — M546 Pain in thoracic spine: Secondary | ICD-10-CM | POA: Diagnosis not present

## 2015-07-22 DIAGNOSIS — Z720 Tobacco use: Secondary | ICD-10-CM | POA: Diagnosis not present

## 2015-07-22 DIAGNOSIS — F17200 Nicotine dependence, unspecified, uncomplicated: Secondary | ICD-10-CM

## 2015-07-22 NOTE — Progress Notes (Signed)
[facility] [address] [place] [address] [phone]  Date:  07/22/2015   Name:  Arthur Collins   DOB:  01-Sep-1991   MRN:  [id number]  PCP:  No PCP Per Patient    Chief Complaint: Flank Pain and Back Pain   History of Present Illness:  Arthur Collins is a 24 y.o. very pleasant male patient who presents with the following:  No change in previous symptoms: HA, back, chest pain. Sleep study now rescheduled on October 27th. No worsening in his symptoms reported previously.   Concerned about b/l lower rib pain along the midaxillary lines.  He also has lower thoracic right sided back pain. It has been going on for 3-4 weeks.  It migrates to various locations bilaterally. .  - No radiculopathy  - Denies loss of bowel or bladder control.  - denies any fevers   Fatigue:Tired all the time.  Appetite normal.  No body fatigue, "mental".  Mentally tired with multiple medical issues. Denies anhedonia. Denies tilt.  Denies much alcohol intake.  6 jobs in total: works 7 days a week.  Avg ~ 70 hours/week.   Denies being depressed.  He is overwhelmed and frustrated by his past attempt to lose weight   Patient Active Problem List   Diagnosis Date Noted  . Atypical chest pain 06/01/2015  . Obesity 06/01/2015  . Esophageal reflux 06/01/2015  . PND (paroxysmal nocturnal dyspnea) 06/01/2015  . Intermittent palpitations 05/12/2015  . Heart murmur, systolic 38/07/1750  . Intractable migraine with aura with status migrainosus 03/18/2015  . Clamminess 03/11/2015  . Headache behind the eyes 03/11/2015  . Dysesthesia of multiple sites 03/11/2015  . Benign paroxysmal positional vertigo 03/11/2015  . OSA on CPAP 03/11/2015    Past Medical History  Diagnosis Date  . GERD (gastroesophageal reflux disease)   . Frequent headaches   . OSA on CPAP 03/11/2015    Dr Lanny Hurst clance.     Past Surgical History  Procedure Laterality Date  . Orif humerus fracture    . Fracture  surgery      Social History  Substance Use Topics  . Smoking status: Current Every Day Smoker -- 0.50 packs/day    Types: Cigarettes  . Smokeless tobacco: Never Used  . Alcohol Use: 0.0 oz/week    0 Standard drinks or equivalent per week     Comment: occasionally    Family History  Problem Relation Age of Onset  . Diabetes Mother   . Hypertension Mother   . Other Father     LIVER ISSUES  . Epilepsy Brother   . Healthy Maternal Grandmother   . Healthy Brother   . Healthy Brother   . Healthy Brother   . Healthy Brother     No Known Allergies  Medication list has been reviewed and updated.  Current Outpatient Prescriptions on File Prior to Visit  Medication Sig Dispense Refill  . esomeprazole (NEXIUM) 20 MG packet Take 20 mg by mouth daily before breakfast. 30 each 12  . meloxicam (MOBIC) 15 MG tablet Take 0.5-1 tablets (7.5-15 mg total) by mouth daily. 30 tablet 0   No current facility-administered medications on file prior to visit.    Review of Systems:  Review of Systems  Constitutional: Negative for fever and chills.  Respiratory: Negative for cough, sputum production, shortness of breath and wheezing.   Cardiovascular: Positive for palpitations.  Gastrointestinal: Positive for heartburn and nausea. Negative for vomiting, abdominal pain, diarrhea  and constipation.  Genitourinary: Negative for dysuria, urgency, frequency, hematuria and flank pain.       Denies penile d/c.   Skin: Negative for rash.  Neurological: Negative for dizziness and headaches.  Psychiatric/Behavioral: Negative for depression and suicidal ideas. The patient is not nervous/anxious and does not have insomnia.     Physical Examination: Filed Vitals:   07/22/15 1920  BP: 130/70  Pulse: 67  Temp: 98.1 F (36.7 C)  Resp: 18   Filed Vitals:   07/22/15 1920  Height: 5\' 8"  (1.727 m)  Weight: 246 lb (111.585 kg)   Body mass index is 37.41 kg/(m^2). Ideal Body Weight: Weight in (lb) to  have BMI = 25: 164.1  GEN: WDWN, NAD, Non-toxic, A & O x 3 HEENT: Atraumatic, Normocephalic. Neck supple. No masses, No LAD. Ears and Nose: No external deformity. CV: RRR, No M/G/R. No JVD. No thrill. No extra heart sounds. PULM: CTA B, no wheezes, crackles, rhonchi. No retractions. No resp. distress. No accessory muscle use. ABD: S, NT, ND, +BS. No rebound. No HSM. Slightly tender adipose tissue along bilateral  Mid axillary line of ribs 10- 12.  EXTR: No c/c/e NEURO Normal gait.  PSYCH: Normally interactive. Conversant. Not depressed or anxious appearing.  Calm demeanor.  Skin: No visible bruising or abnormality or tender spots on bilateral lower ribs. He does describe nodular feel over this area that appears to be fatty tissue. Patient has full rom of his back.  Negative straight leg raise bilaterally.  Assessment and Plan: Fatigue/Obesity: discussed lifestyle and sleep apnea. Sleep study pending.  Had long discussion about diet and exercise.  Given [employer] ladder website and mediterranean diet handout.  Strongly encouraged smoking cessation and elimination of sweet tea. Discussed ideal way to lose weight.  He is very frustrated about his weight and inability to lose it.  Nutrition referral.   Paraspinal back pain: Mild in nature.  No red flags. No concern for STD or UTI. Also with lower b/l fatty tissue pain. Reassured. F/u if persistent or worsening.   Signed Gerre Pebbles, MD

## 2015-07-22 NOTE — Patient Instructions (Signed)

## 2015-07-23 ENCOUNTER — Institutional Professional Consult (permissible substitution): Payer: 59 | Admitting: Neurology

## 2015-07-30 ENCOUNTER — Ambulatory Visit: Payer: 59 | Admitting: Urgent Care

## 2015-08-13 ENCOUNTER — Encounter: Payer: Self-pay | Admitting: Neurology

## 2015-08-13 ENCOUNTER — Ambulatory Visit (INDEPENDENT_AMBULATORY_CARE_PROVIDER_SITE_OTHER): Payer: 59 | Admitting: Neurology

## 2015-08-13 VITALS — BP 124/81 | HR 66 | Ht 70.0 in | Wt 241.0 lb

## 2015-08-13 DIAGNOSIS — M2619 Other specified anomalies of jaw-cranial base relationship: Secondary | ICD-10-CM

## 2015-08-13 DIAGNOSIS — R5382 Chronic fatigue, unspecified: Secondary | ICD-10-CM

## 2015-08-13 DIAGNOSIS — R51 Headache: Secondary | ICD-10-CM | POA: Diagnosis not present

## 2015-08-13 DIAGNOSIS — R0683 Snoring: Secondary | ICD-10-CM | POA: Diagnosis not present

## 2015-08-13 DIAGNOSIS — R5381 Other malaise: Secondary | ICD-10-CM | POA: Diagnosis not present

## 2015-08-13 DIAGNOSIS — R519 Headache, unspecified: Secondary | ICD-10-CM

## 2015-08-13 DIAGNOSIS — G4726 Circadian rhythm sleep disorder, shift work type: Secondary | ICD-10-CM

## 2015-08-13 NOTE — Patient Instructions (Signed)

## 2015-08-13 NOTE — Progress Notes (Signed)
SLEEP MEDICINE CLINIC   Provider:  Larey Seat, M D  Referring Provider: Jaynee Eagles, PA-C Primary Care Physician:  Dr. Tammi Klippel   Chief Complaint  Patient presents with  . NP Sleep Consult/Migraine Rm 10  . RV Migraines/BPPV/consult parox noct dyspnea  . Not using zecuity anymore.    HPI:  Arthur Collins is a 24 y.o. male , seen here as a new referral from Dr. Tammi Klippel at Urgent care,   Chief complaint according to patient : The patient has been severely fatigued and daytime sleepy for several months. In addition he suffers from headache and vertigo. He becomes diaphoretic easily  Patient was just diagnosed with valvular heart disease, I had seen Mr. Arthur Collins on 03-02-15 and a consult was to be addressing headaches and vertigo as well as some right arm numbness. In our first consult the chief complaint was that of dysesthesias. Headaches begun in early May 2016 almost overnight. At the time he worked third shift at the local hospital and felt on a Thursday afternoon at tingling dysesthesia in the right arm. At the time year to sleep during the day since he worked at night. He did work through the night following the onset of dysesthesias in the next morning had to present to the emergency room. His symptoms continued to increase in intensity and radiated to his right neck and had he felt puffy, his face was swollen and he felt that he was hot and diaphoretic all this occurred first on the right side of his body and had a headache. Uncharacteristically  for migraine but more a burning or throbbing sensation with a sharp stabbing sensation and pulsating pressure all related to the right eye. Retro-orbital pressure.  Over the following 14 days leading into June 2016, both eyes were at times hurting with the same quality but were not involved at the same time.  He was referred for sleep study at [facility] in [place] to Dr . Danton Sewer but did never follow.   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 He continues to smoke but he has switched from sweetened iced tea and soda to a much lower caffeine intake. The associated vertigo sensation that he described as if the room is spinning around him became slowly less intense. The headache was rated 6 out of 10 in intensity laboratory tests showed normal CBC is a CT of the head was normal- followed by an MRI of the head with and without contrast obtained on 03-06-15 -which again was read as normal.  He continued to feel diaphoretic to this day. An MRI of the C-spine was ordered but denied by insurance and vestibular rehabilitation was order was ordered, but he declined "until  the sleep study was done".An EMG and nerve conduction study for the right upper extremity was ordered and returned at normal. A transcranial Doppler study for vertebrobasilar flow did not reveal any abnormalities either. He did not keep appointments to follow up with rehab as the patient became quite frustrated.   He was diagnosed with a fatty liver and valvular heart disease with murmur - Dr Freida Busman / Dr Mare Ferrari.    He has now changed from the third shift to morning shift which is helping him a little bit in "getting more sleep" .   Sleep habits are as follows:  His usual bedtime now is 12:30 AM or 1 AM thereabouts. He usually falls asleep promptly. He will  sleeps through for about 6 AM when he has to rise for work. He relies on several alarms. He no longer drinks caffeine in the morning. He shows me his smart phone alarm setting and he pushes the snooze button at least for 5 times before rising finally he feels not restored or refreshed in the morning not ready to go. Sometimes he wakes up with a headache, he has regularly a dry mouth in the morning and he feels very thirsty. He doesn't nap in daytime - he feels exhausted, but cannot sleep. Yesterday was an exception , going to bed at 9.30 PM he woke up after 8 hours and  still feels not more restored. He can sleep a whole day but feels not rested at all. He has been told he snores, and that is without supine exacerbation. He reports he cannot sleep on his back and usually sleeps prone or on his side.  He feels irritated and anxious , but not depressed, worried abut his symptoms.   Social history:  Caffeine intake reduced to one cup of tea. Cigarettes 1 ppd, ETOH socially , 1-2 weekly at the most.  Review of Systems: Out of a complete 14 system review, the patient complains of only the following symptoms, and all other reviewed systems are negative. Nausea, vomiting, vertigo, diaphoresis, irritability . Swollen lymph node left neck. Marland Kitchen   Epworth score 7 , Fatigue severity score 46  , depression score N/A   Social History   Social History  . Marital Status: Single    Spouse Name: N/A  . Number of Children: N/A  . Years of Education: N/A   Occupational History  . Not on file.   Social History Main Topics  . Smoking status: Current Every Day Smoker -- 1.00 packs/day    Types: Cigarettes  . Smokeless tobacco: Never Used  . Alcohol Use: 0.0 oz/week    0 Standard drinks or equivalent per week     Comment: occasionally  . Drug Use: No  . Sexual Activity: Not on file   Other Topics Concern  . Not on file   Social History Narrative   Drinks 2-3 cups of caffeine.    Family History  Problem Relation Age of Onset  . Diabetes Mother   . Hypertension Mother   . Other Father     LIVER ISSUES  . Epilepsy Brother   . Healthy Maternal Grandmother   . Healthy Brother   . Healthy Brother   . Healthy Brother   . Healthy Brother     Past Medical History  Diagnosis Date  . GERD (gastroesophageal reflux disease)   . Frequent headaches   . OSA on CPAP 03/11/2015    Dr Lanny Hurst clance.     Past Surgical History  Procedure Laterality Date  . Orif humerus fracture    . Fracture surgery      Current Outpatient Prescriptions  Medication Sig  Dispense Refill  . esomeprazole (NEXIUM) 20 MG packet Take 20 mg by mouth daily before breakfast. 30 each 12  . meloxicam (MOBIC) 15 MG tablet Take 0.5-1 tablets (7.5-15 mg total) by mouth daily. 30 tablet 0   No current facility-administered medications for this visit.    Allergies as of 08/13/2015  . (No Known Allergies)    Vitals: BP 124/81 mmHg  Pulse 66  Ht 5\' 10"  (1.778 m)  Wt 241 lb (109.317 kg)  BMI 34.58 kg/m2 Last Weight:  Wt Readings from Last 1 Encounters:  08/13/15 241  lb (109.317 kg)   EHU:DJSH mass index is 34.58 kg/(m^2).     Last Height:   Ht Readings from Last 1 Encounters:  08/13/15 5\' 10"  (1.778 m)    Physical exam:  General: The patient is awake, alert and appears not in acute distress. The patient is well groomed. Head: Normocephalic, atraumatic. Neck is supple. Mallampati 3   neck circumference:16.5 . Nasal airflow restricted , TMJ not  evident . Retrognathia is seen. Lisp.  Cardiovascular:  Regular rate and rhythm , with no audible carotid bruit, and without distended neck veins. Respiratory: Lungs are clear to auscultation. Skin:  Without evidence of edema, or rash Trunk: BMI is elevated .   Neurologic exam : The patient is awake and alert, oriented to place and time.   Memory subjective  described as intact.  Attention span & concentration ability appears normal. He is a little fidgety.  Speech is fluent, without dysarthria, dysphonia or aphasia.  Mood and affect are appropriate.  Cranial nerves: Pupils are equal and briskly reactive to light. Funduscopic exam without  evidence of pallor or edema. Extraocular movements  in vertical and horizontal planes intact and without nystagmus. Visual fields by finger perimetry are intact. Hearing to finger rub intact.   Facial sensation intact to fine touch.  Facial motor strength is symmetric and tongue and uvula move midline. Shoulder shrug was symmetrical.   Motor exam: reduced tone, muscle bulk and  symmetric strength in all extremities. Bilateral weaker grip strength.  Sensory:  Fine touch, pinprick and vibration were tested in all extremities. Proprioception  was normal. Coordination: Rapid alternating movements in the fingers/hands was normal. Finger-to-nose maneuver  normal without evidence of ataxia, dysmetria or tremor. Gait and station: Patient walks without assistive device and is able unassisted to climb up to the exam table. Strength within normal limits.  Stance is stable and normal.  Toe and heel stand were tested .Tandem gait is unfragmented. Turns with 3   Steps.   Deep tendon reflexes: in the  upper and lower extremities are symmetric and intact. Babinski maneuver response is downgoing.  The patient was advised of the nature of the diagnosed sleep disorder, the treatment options and risks for general a health and wellness arising from not treating the condition.  I spent more than 45  minutes of face to face time with the patient. Greater than 50% of time was spent in counseling and coordination of care. We have discussed the diagnosis and differential and I answered the patient's questions.     Assessment:  After physical and neurologic examination, review of laboratory studies,  Personal review of imaging studies, reports of other /same  Imaging studies ,  Results of polysomnography/ neurophysiology testing and pre-existing records as far as provided in visit., my assessment is   1) Mr. 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  He has now returned to a "normal daytime work schedulel", and averages only 5-6 hours of sleep. He has observed that even sleeping longer does not refreshed or restored more.  2) non-restorative sleep with extreme daytime fatigue may be related to snoring and unidentified apnea. His body mass index is elevated he does have a higher Mallampati and a larger neck circumference  than average for his age and gender. I would like for him to undergo an attended sleep study which include CO2 monitoring since he awakens with headaches and sometimes headaches wake him  out of sleep. We may need to evaluate him first further for cluster headaches.  3) the patient still reports diaphoresis dysesthesias, abnormal muscle tone and headaches of various qualities his vertigo has improved as has the other symptoms over the last couple of months but they have not resolved. My role will be to identify an organic sleep disorder and treat it accordingly.   The patient mentions multiple other problems including achiness, joint pain especially at the right knee and left elbow which may point to a rheumatological problem.   There could also be a somatization component if he is worried and feels irritable.    Plan:  Treatment plan and additional workup : SPLIT PSG with CO2.   CC Dr. Mare Ferrari. Rv after sleep study.     Asencion Partridge Shterna Laramee MD  08/13/2015   CC: Jaynee Eagles, Pa-c [address] [place], [place] [id number]

## 2015-08-16 NOTE — Addendum Note (Signed)
Addended by: Wardell Honour on: 08/16/2015 11:12 PM   Modules accepted: Level of Service

## 2015-08-16 NOTE — Progress Notes (Signed)
History and physical examinations reviewed in detail with Dr. Jimmye Norman. Agree with assessment and plan. Kyce Ging Elayne Guerin, M.D. [facility] [address] [place], [place]  [address] [phone] phone [phone] fax

## 2015-11-23 DIAGNOSIS — L509 Urticaria, unspecified: Secondary | ICD-10-CM | POA: Diagnosis not present

## 2015-11-23 DIAGNOSIS — L500 Allergic urticaria: Secondary | ICD-10-CM | POA: Diagnosis not present

## 2015-11-27 ENCOUNTER — Telehealth: Payer: Self-pay

## 2015-11-27 NOTE — Telephone Encounter (Signed)
Pt is needing very much to get a call back from Jaynee Eagles would not go into details  Best number [phone]

## 2015-11-28 NOTE — Telephone Encounter (Signed)
I am not back in the clinic until Tuesday. This could have easily been looked up in the schedule. Please let patient know. He can come in to be seen before then by a different provider until then. Thank you.

## 2015-11-30 ENCOUNTER — Telehealth: Payer: Self-pay | Admitting: Neurology

## 2015-11-30 NOTE — Telephone Encounter (Signed)
Left message with patient regarding his call on 11/27/15.  I advised patient that PA Bess Harvest will not be in the office until 12/01/15; I also provided PA Mani's schedule for 12/01/15 and encouraged him to present for evaluation if he needed to speak with him.  I also offered for patient to present today to see another provider if necessary.  No further action warranted.

## 2015-11-30 NOTE — Telephone Encounter (Signed)
There is an order placed for a split night on 08/13/2015.

## 2015-11-30 NOTE — Telephone Encounter (Signed)
Patient has scheduled his sleep study however I don't have an order for the study.  I see in the plan for a sleep study.  Could I get an order?

## 2015-12-03 ENCOUNTER — Ambulatory Visit (INDEPENDENT_AMBULATORY_CARE_PROVIDER_SITE_OTHER): Payer: 59 | Admitting: Neurology

## 2015-12-03 DIAGNOSIS — R0683 Snoring: Secondary | ICD-10-CM

## 2015-12-03 DIAGNOSIS — G4710 Hypersomnia, unspecified: Secondary | ICD-10-CM

## 2015-12-03 DIAGNOSIS — E669 Obesity, unspecified: Secondary | ICD-10-CM

## 2015-12-03 DIAGNOSIS — R0600 Dyspnea, unspecified: Secondary | ICD-10-CM

## 2015-12-04 NOTE — Sleep Study (Signed)
Please see the scanned sleep study interpretation located in the procedure tab in the chart view section.  

## 2015-12-10 ENCOUNTER — Telehealth: Payer: Self-pay

## 2015-12-10 NOTE — Telephone Encounter (Signed)
LM that sleep study came back within normal limits. I have fax report to PCP.

## 2015-12-10 NOTE — Telephone Encounter (Signed)
Incorrect encounter

## 2015-12-30 ENCOUNTER — Ambulatory Visit (INDEPENDENT_AMBULATORY_CARE_PROVIDER_SITE_OTHER): Payer: 59 | Admitting: Physician Assistant

## 2015-12-30 VITALS — BP 126/76 | HR 76 | Temp 99.3°F | Resp 16 | Ht 69.0 in | Wt 234.0 lb

## 2015-12-30 DIAGNOSIS — J069 Acute upper respiratory infection, unspecified: Secondary | ICD-10-CM

## 2015-12-30 NOTE — Progress Notes (Signed)
   Subjective:    Patient ID: Arthur Collins, male    DOB: 1991/10/09, 25 y.o.   MRN: [id number]  Chief Complaint  Patient presents with  . Cough    x 2 days   . Sore Throat  . chest pain  . ears drainage   Medications, allergies, past medical history, surgical history, family history, social history and problem list reviewed and updated.  HPI  51 yom presents with above complaints.   Symptoms started 2 days ago with head/nasal congestion. Now with non prod cough past 24 hours. Mild frontal headache yest, better today. Mild sore throat past 2 days. Feels that left ear is draining. Denies abd pain, n/v, diarrhea, fevers. Chills past 2 nights.   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   Review of Systems See HPI     Objective:   Physical Exam  Constitutional: He appears well-developed and well-nourished.  Non-toxic appearance. He does not have a sickly appearance. He does not appear ill. No distress.  BP 126/76 mmHg  Pulse 76  Temp(Src) 99.3 F (37.4 C) (Oral)  Resp 16  Ht 5\' 9"  (1.753 m)  Wt 234 lb (106.142 kg)  BMI 34.54 kg/m2  SpO2 98%   HENT:  Right Ear: Tympanic membrane normal.  Left Ear: Tympanic membrane normal.  Nose: Mucosal edema and rhinorrhea present. Right sinus exhibits no maxillary sinus tenderness and no frontal sinus tenderness. Left sinus exhibits no maxillary sinus tenderness and no frontal sinus tenderness.  Mouth/Throat: Uvula is midline, oropharynx is clear and moist and mucous membranes are normal.  Neck: No Brudzinski's sign noted.  Pulmonary/Chest: Effort normal and breath sounds normal. No tachypnea.  Lymphadenopathy:       Head (right side): No submental, no submandibular and no tonsillar adenopathy present.       Head (left side): No submental, no submandibular and no tonsillar adenopathy present.    He has no cervical adenopathy.      Assessment & Plan:   Viral URI --doubt bacterial etiology with benign exam, normal  vitals, recently started symptoms --rest, fluids, tylenol prn, mucinex-d for head/nasal congestion --rtc if no improvement 4-5 days  Julieta Gutting, PA-C Physician Assistant-Certified [facility]  12/30/2015 3:23 PM

## 2015-12-30 NOTE — Patient Instructions (Addendum)
You likely have a viral illness at this time causing your symptoms. Getting plenty of rest, fluids, and tylenol as needed over the next few days will be important.  Please let us know if you're not improved in 4-5 days.

## 2016-02-01 ENCOUNTER — Ambulatory Visit (INDEPENDENT_AMBULATORY_CARE_PROVIDER_SITE_OTHER): Payer: 59

## 2016-02-01 ENCOUNTER — Ambulatory Visit (INDEPENDENT_AMBULATORY_CARE_PROVIDER_SITE_OTHER): Payer: 59 | Admitting: Family Medicine

## 2016-02-01 VITALS — BP 120/74 | HR 69 | Temp 98.0°F | Resp 18 | Ht 67.75 in | Wt 232.8 lb

## 2016-02-01 DIAGNOSIS — S5002XA Contusion of left elbow, initial encounter: Secondary | ICD-10-CM | POA: Diagnosis not present

## 2016-02-01 DIAGNOSIS — S59902A Unspecified injury of left elbow, initial encounter: Secondary | ICD-10-CM

## 2016-02-01 DIAGNOSIS — S4991XA Unspecified injury of right shoulder and upper arm, initial encounter: Secondary | ICD-10-CM | POA: Diagnosis not present

## 2016-02-01 DIAGNOSIS — M25522 Pain in left elbow: Secondary | ICD-10-CM | POA: Diagnosis not present

## 2016-02-01 DIAGNOSIS — M79622 Pain in left upper arm: Secondary | ICD-10-CM | POA: Diagnosis not present

## 2016-02-01 MED ORDER — TRAMADOL HCL 50 MG PO TABS: 50.0000 mg | ORAL_TABLET | Freq: Four times a day (QID) | ORAL | Status: DC | PRN

## 2016-02-01 NOTE — Progress Notes (Signed)
By signing my name below, I, Mesha Guiyard, attest that this documentation has been prepared under the direction and in the presence of Delman Cheadle, MD.  Electronically Signed: Verlee Monte, Medical Scribe. 02/01/2016. 2:19 PM.  Subjective:    Patient ID: Arthur Collins, male    DOB: 24-Sep-1991, 25 y.o.   MRN: [id number]  HPI Chief Complaint  Patient presents with  . Elbow Pain    Lt Elbow injury / pain from a fall x yesterday night    HPI Comments: Arthur Collins is a 25 y.o. male who presents to the [facility] complaining of left elbow pain since slamming it against the cart yesterday while playing at work. Pt reports tenderness in his upper arm immediatly after impact. Pt reports breaking his left elbow in the past and having surgery on it. Pt denies using ice, or tylenol for pain.    Past Surgical History  Procedure Laterality Date  . Orif humerus fracture    . Fracture surgery     No Known Allergies Prior to Admission medications   Medication Sig Start Date End Date Taking? Authorizing Provider  esomeprazole (NEXIUM) 20 MG packet Take 20 mg by mouth daily before breakfast. 06/01/15  Yes Jaynee Eagles, PA-C  meloxicam (MOBIC) 15 MG tablet Take 0.5-1 tablets (7.5-15 mg total) by mouth daily. 07/07/15  Yes Jaynee Eagles, PA-C     Review of Systems  Constitutional: Positive for activity change.  Gastrointestinal: Negative for abdominal pain.  Musculoskeletal: Positive for myalgias, joint swelling and arthralgias. Negative for gait problem.  Skin: Positive for color change. Negative for wound.  Neurological: Positive for headaches. Negative for tremors and numbness.  Hematological: Does not bruise/bleed easily.       Objective:   Physical Exam  Constitutional: He appears well-developed and well-nourished. No distress.  HENT:  Head: Normocephalic and atraumatic.  Eyes: Conjunctivae are normal.  Neck: Neck supple.  Cardiovascular: Normal rate.     Pulmonary/Chest: Effort normal.  Musculoskeletal:       Left forearm: He exhibits tenderness (on the olecranon).  Pt can fully extend arm, can turn his arm. TTP on distal aspect of the mid upper arm Tenderness to palpations on the olecranon and lateral epicondyle Full pronation and supination. Full extension and flexion of the elbow.  Neurological: He is alert.  Skin: Skin is warm and dry.  Psychiatric: He has a normal mood and affect. His behavior is normal.  Nursing note and vitals reviewed.  Dg Elbow Complete Left  02/01/2016  CLINICAL DATA:  Status post fall last night with a blow to the left elbow. Pain. Initial encounter. EXAM: LEFT ELBOW - COMPLETE 3+ VIEW COMPARISON:  None. FINDINGS: There is no evidence of fracture, dislocation, or joint effusion. There is no evidence of arthropathy or other focal bone abnormality. Soft tissues are unremarkable. IMPRESSION: Negative exam. Electronically Signed   By: Inge Rise M.D.   On: 02/01/2016 14:43   Dg Humerus Left  02/01/2016  CLINICAL DATA:  Status post fall last night with a right upper arm injury. Pain. Initial encounter. EXAM: LEFT HUMERUS - 2+ VIEW COMPARISON:  None. FINDINGS: There is no evidence of fracture or other focal bone lesions. Soft tissues are unremarkable. IMPRESSION: Negative exam. Electronically Signed   By: Inge Rise M.D.   On: 02/01/2016 14:44   Assessment & Plan:   1. Elbow injury, left, initial encounter   2. Elbow contusion, left, initial encounter   RICE Placed  in sling Out of work, recheck in 3d  Orders Placed This Encounter  Procedures  . DG Elbow Complete Left    Standing Status: Future     Number of Occurrences: 1     Standing Expiration Date: 01/31/2017    Order Specific Question:  Reason for Exam (SYMPTOM  OR DIAGNOSIS REQUIRED)    Answer:  landed on olecranon in fall last night. h/o ORIF to olecranon after fracture yrs prior. humerus tender to even light palpation    Order Specific  Question:  Preferred imaging location?    Answer:  External  . DG Humerus Left    Standing Status: Future     Number of Occurrences: 1     Standing Expiration Date: 01/31/2017    Order Specific Question:  Reason for Exam (SYMPTOM  OR DIAGNOSIS REQUIRED)    Answer:  landed on olecranon in fall last night. h/o ORIF to olecranon after fracture yrs prior. humerus tender to even light palpation    Order Specific Question:  Preferred imaging location?    Answer:  External    Meds ordered this encounter  Medications  . traMADol (ULTRAM) 50 MG tablet    Sig: Take 1 tablet (50 mg total) by mouth every 6 (six) hours as needed.    Dispense:  40 tablet    Refill:  0    I personally performed the services described in this documentation, which was scribed in my presence. The recorded information has been reviewed and considered, and addended by me as needed.  Delman Cheadle, MD MPH

## 2016-02-01 NOTE — Patient Instructions (Addendum)
Elastic Bandage and RICE WHAT DOES AN ELASTIC BANDAGE DO? Elastic bandages come in different shapes and sizes. They generally provide support to your injury and reduce swelling while you are healing, but they can perform different functions. Your health care provider will help you to decide what is best for your protection, recovery, or rehabilitation following an injury. WHAT ARE SOME GENERAL TIPS FOR USING AN ELASTIC BANDAGE?  Use the bandage as directed by the maker of the bandage that you are using.  Do not wrap the bandage too tightly. This may cut off the circulation in the arm or leg in the area below the bandage.  If part of your body beyond the bandage becomes blue, numb, cold, swollen, or is more painful, your bandage is most likely too tight. If this occurs, remove your bandage and reapply it more loosely.  See your health care provider if the bandage seems to be making your problems worse rather than better.  An elastic bandage should be removed and reapplied every 3-4 hours or as directed by your health care provider. WHAT IS RICE? The routine care of many injuries includes rest, ice, compression, and elevation (RICE therapy).  Rest Rest is required to allow your body to heal. Generally, you can resume your routine activities when you are comfortable and have been given permission by your health care provider. Ice Icing your injury helps to keep the swelling down and it reduces pain. Do not apply ice directly to your skin.  Put ice in a plastic bag.  Place a towel between your skin and the bag.  Leave the ice on for 20 minutes, 2-3 times per day. Do this for as long as you are directed by your health care provider. Compression Compression helps to keep swelling down, gives support, and helps with discomfort. Compression may be done with an elastic bandage. Elevation Elevation helps to reduce swelling and it decreases pain. If possible, your injured area should be placed at  or above the level of your heart or the center of your chest. [place]? You should seek medical care if:  You have persistent pain and swelling.  Your symptoms are getting worse rather than improving. These symptoms may indicate that further evaluation or further X-rays are needed. Sometimes, X-rays may not show a small broken bone (fracture) until a number of days later. Make a follow-up appointment with your health care provider. Ask when your X-ray results will be ready. Make sure that you get your X-ray results. WHEN SHOULD I SEEK IMMEDIATE MEDICAL CARE? You should seek immediate medical care if:  You have a sudden onset of severe pain at or below the area of your injury.  You develop redness or increased swelling around your injury.  You have tingling or numbness at or below the area of your injury that does not improve after you remove the elastic bandage.   This information is not intended to replace advice given to you by your health care provider. Make sure you discuss any questions you have with your health care provider.   Document Released: 03/25/2002 Document Revised: 06/24/2015 Document Reviewed: 05/19/2014 Elsevier Interactive Patient Education 2016 [employer].     IF you received an x-ray today, you will receive an invoice from [facility]. Please contact [facility] at [phone] with questions or concerns regarding your invoice.   IF you received labwork today, you will receive an invoice from [employer]. Please contact [facility] at  [phone] with questions or concerns regarding your invoice.   Our billing staff will not be able to assist you with questions regarding bills from these companies.  You will be contacted with the lab results as soon as they are available. The fastest way to get your results is to activate your My Chart account. Instructions are located on the last page of this  paperwork. If you have not heard from [place] regarding the results in 2 weeks, please contact this office.    Elbow Contusion An elbow contusion is a deep bruise of the elbow. Contusions are the result of an injury that caused bleeding under the skin. The contusion may turn blue, purple, or yellow. Minor injuries will give you a painless contusion, but more severe contusions may stay painful and swollen for a few weeks.  CAUSES  An elbow contusion comes from a direct force to that area, such as falling on the elbow. SYMPTOMS   Swelling and redness of the elbow.  Bruising of the elbow area.  Tenderness or soreness of the elbow. DIAGNOSIS  You will have a physical exam and will be asked about your history. You may need an X-ray of your elbow to look for a broken bone (fracture).  TREATMENT  A sling or splint may be needed to support your injury. Resting, elevating, and applying cold compresses to the elbow area are often the best treatments for an elbow contusion. Over-the-counter medicines may also be recommended for pain control. HOME CARE INSTRUCTIONS   Put ice on the injured area.  Put ice in a plastic bag.  Place a towel between your skin and the bag.  Leave the ice on for 15-20 minutes, 03-04 times a day.  Only take over-the-counter or prescription medicines for pain, discomfort, or fever as directed by your caregiver.  Rest your injured elbow until the pain and swelling are better.  Elevate your elbow to reduce swelling.  Apply a compression wrap as directed by your caregiver. This can help reduce swelling and motion. You may remove the wrap for sleeping, showers, and baths. If your fingers become numb, cold, or blue, take the wrap off and reapply it more loosely.  Use your elbow only as directed by your caregiver. You may be asked to do range of motion exercises. Do them as directed.  See your caregiver as directed. It is very important to keep all follow-up appointments in  order to avoid any long-term problems with your elbow, including chronic pain or inability to move your elbow normally. SEEK IMMEDIATE MEDICAL CARE IF:   You have increased redness, swelling, or pain in your elbow.  Your swelling or pain is not relieved with medicines.  You have swelling of the hand and fingers.  You are unable to move your fingers or wrist.  You begin to lose feeling in your hand or fingers.  Your fingers or hand become cold or blue. MAKE SURE YOU:   Understand these instructions.  Will watch your condition.  Will get help right away if you are not doing well or get worse.   This information is not intended to replace advice given to you by your health care provider. Make sure you discuss any questions you have with your health care provider.   Document Released: 09/11/2006 Document Revised: 12/26/2011 Document Reviewed: 05/18/2015 Elsevier Interactive Patient Education [employer].

## 2016-02-05 ENCOUNTER — Ambulatory Visit (INDEPENDENT_AMBULATORY_CARE_PROVIDER_SITE_OTHER): Payer: 59 | Admitting: Family Medicine

## 2016-02-05 VITALS — BP 122/80 | HR 78 | Temp 98.1°F | Resp 16 | Ht 67.75 in | Wt 232.0 lb

## 2016-02-05 DIAGNOSIS — S59902D Unspecified injury of left elbow, subsequent encounter: Secondary | ICD-10-CM | POA: Diagnosis not present

## 2016-02-05 DIAGNOSIS — S40022D Contusion of left upper arm, subsequent encounter: Secondary | ICD-10-CM

## 2016-02-05 MED ORDER — MELOXICAM 15 MG PO TABS: 15.0000 mg | ORAL_TABLET | Freq: Every day | ORAL | Status: DC

## 2016-02-05 NOTE — Patient Instructions (Addendum)
IF you received an x-ray today, you will receive an invoice from [facility]. Please contact [facility] at [phone] with questions or concerns regarding your invoice.   IF you received labwork today, you will receive an invoice from [employer]. Please contact [facility] at [phone] with questions or concerns regarding your invoice.   Our billing staff will not be able to assist you with questions regarding bills from these companies.  You will be contacted with the lab results as soon as they are available. The fastest way to get your results is to activate your My Chart account. Instructions are located on the last page of this paperwork. If you have not heard from [place] regarding the results in 2 weeks, please contact this office.    Hematoma A hematoma is a collection of blood under the skin, in an organ, in a body space, in a joint space, or in other tissue. The blood can clot to form a lump that you can see and feel. The lump is often firm and may sometimes become sore and tender. Most hematomas get better in a few days to weeks. However, some hematomas may be serious and require medical care. Hematomas can range in size from very small to very large. CAUSES  A hematoma can be caused by a blunt or penetrating injury. It can also be caused by spontaneous leakage from a blood vessel under the skin. Spontaneous leakage from a blood vessel is more likely to occur in older people, especially those taking blood thinners. Sometimes, a hematoma can develop after certain medical procedures. SIGNS AND SYMPTOMS   A firm lump on the body.  Possible pain and tenderness in the area.  Bruising.Blue, dark blue, purple-red, or yellowish skin may appear at the site of the hematoma if the hematoma is close to the surface of the skin. For hematomas in deeper tissues or body spaces, the signs and symptoms may be subtle. For example, an intra-abdominal  hematoma may cause abdominal pain, weakness, fainting, and shortness of breath. An intracranial hematoma may cause a headache or symptoms such as weakness, trouble speaking, or a change in consciousness. DIAGNOSIS  A hematoma can usually be diagnosed based on your medical history and a physical exam. Imaging tests may be needed if your health care provider suspects a hematoma in deeper tissues or body spaces, such as the abdomen, head, or chest. These tests may include ultrasonography or a CT scan.  TREATMENT  Hematomas usually go away on their own over time. Rarely does the blood need to be drained out of the body. Large hematomas or those that may affect vital organs will sometimes need surgical drainage or monitoring. HOME CARE INSTRUCTIONS   Apply ice to the injured area:   Put ice in a plastic bag.   Place a towel between your skin and the bag.   Leave the ice on for 20 minutes, 2-3 times a day for the first 1 to 2 days.   After the first 2 days, switch to using warm compresses on the hematoma.   Elevate the injured area to help decrease pain and swelling. Wrapping the area with an elastic bandage may also be helpful. Compression helps to reduce swelling and promotes shrinking of the hematoma. Make sure the bandage is not wrapped too tight.   If your hematoma is on a lower extremity and is painful, crutches may be helpful for a couple days.   Only take over-the-counter or prescription  medicines as directed by your health care provider. SEEK IMMEDIATE MEDICAL CARE IF:   You have increasing pain, or your pain is not controlled with medicine.   You have a fever.   You have worsening swelling or discoloration.   Your skin over the hematoma breaks or starts bleeding.   Your hematoma is in your chest or abdomen and you have weakness, shortness of breath, or a change in consciousness.  Your hematoma is on your scalp (caused by a fall or injury) and you have a worsening  headache or a change in alertness or consciousness. MAKE SURE YOU:   Understand these instructions.  Will watch your condition.  Will get help right away if you are not doing well or get worse.   This information is not intended to replace advice given to you by your health care provider. Make sure you discuss any questions you have with your health care provider.   Document Released: 05/17/2004 Document Revised: 06/05/2013 Document Reviewed: 03/13/2013 Elsevier Interactive Patient Education 2016 [place].  Elbow Contusion An elbow contusion is a deep bruise of the elbow. Contusions are the result of an injury that caused bleeding under the skin. The contusion may turn blue, purple, or yellow. Minor injuries will give you a painless contusion, but more severe contusions may stay painful and swollen for a few weeks.  CAUSES  An elbow contusion comes from a direct force to that area, such as falling on the elbow. SYMPTOMS   Swelling and redness of the elbow.  Bruising of the elbow area.  Tenderness or soreness of the elbow. DIAGNOSIS  You will have a physical exam and will be asked about your history. You may need an X-ray of your elbow to look for a broken bone (fracture).  TREATMENT  A sling or splint may be needed to support your injury. Resting, elevating, and applying cold compresses to the elbow area are often the best treatments for an elbow contusion. Over-the-counter medicines may also be recommended for pain control. HOME CARE INSTRUCTIONS   Put ice on the injured area.  Put ice in a plastic bag.  Place a towel between your skin and the bag.  Leave the ice on for 15-20 minutes, 03-04 times a day.  Only take over-the-counter or prescription medicines for pain, discomfort, or fever as directed by your caregiver.  Rest your injured elbow until the pain and swelling are better.  Elevate your elbow to reduce swelling.  Apply a compression wrap as directed by your  caregiver. This can help reduce swelling and motion. You may remove the wrap for sleeping, showers, and baths. If your fingers become numb, cold, or blue, take the wrap off and reapply it more loosely.  Use your elbow only as directed by your caregiver. You may be asked to do range of motion exercises. Do them as directed.  See your caregiver as directed. It is very important to keep all follow-up appointments in order to avoid any long-term problems with your elbow, including chronic pain or inability to move your elbow normally. SEEK IMMEDIATE MEDICAL CARE IF:   You have increased redness, swelling, or pain in your elbow.  Your swelling or pain is not relieved with medicines.  You have swelling of the hand and fingers.  You are unable to move your fingers or wrist.  You begin to lose feeling in your hand or fingers.  Your fingers or hand become cold or blue. MAKE SURE YOU:   Understand these  instructions.  Will watch your condition.  Will get help right away if you are not doing well or get worse.   This information is not intended to replace advice given to you by your health care provider. Make sure you discuss any questions you have with your health care provider.   Document Released: 09/11/2006 Document Revised: 12/26/2011 Document Reviewed: 05/18/2015 Elsevier Interactive Patient Education [employer].

## 2016-02-05 NOTE — Progress Notes (Signed)
Subjective:    Patient ID: Arthur Collins, male    DOB: Jan 02, 1991, 25 y.o.   MRN: [id number] By signing my name below, I, Zola Button, attest that this documentation has been prepared under the direction and in the presence of Delman Cheadle, MD.  Electronically Signed: Zola Button, Medical Scribe. 02/05/2016. 5:47 PM.  Chief Complaint  Patient presents with  . Follow-up    left elbow injury     HPI HPI Comments: Arthur Collins is a 25 y.o. male who presents to the [facility] for a follow-up for left elbow injury that occurred 5 days ago. Patient was initially seen by me for this 5 days ago. He still has soreness to his left elbow and also has a hematoma on his upper arm. He has been sleeping fine. Patient tried to lift up a tray at work, but was unable to due to the pain. He has been using the tramadol and uses the sling when relaxing at home. He has not been using ice or meloxicam.  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  Review of Systems  Constitutional: Positive for activity change and appetite change. Negative for fever and chills.  Gastrointestinal: Negative for abdominal pain and constipation.  Musculoskeletal: Positive for myalgias, joint swelling and arthralgias. Negative for back pain, gait problem, neck pain and neck stiffness.  Skin: Positive for wound. Negative for color change and rash.  Neurological: Negative for weakness and numbness.  Hematological: Negative for adenopathy. Does not bruise/bleed easily.  Psychiatric/Behavioral: Negative for sleep disturbance.       Objective:  BP 122/80 mmHg  Pulse 78  Temp(Src) 98.1 F (36.7 C) (Oral)  Resp 16  Ht 5' 7.75" (1.721 m)  Wt 232 lb (105.235 kg)  BMI 35.53 kg/m2  SpO2 98%  Physical Exam  Constitutional: He is oriented to person, place, and time.  He appears well-developed and well-nourished. No distress.  HENT:  Head: Normocephalic and atraumatic.  Mouth/Throat: Oropharynx is clear and moist. No oropharyngeal exudate.  Eyes: Pupils are equal, round, and reactive to light.  Neck: Neck supple.  Cardiovascular: Normal rate.   Pulmonary/Chest: Effort normal.  Musculoskeletal: He exhibits no edema.  Pain over the left olecranon. No pain over medial or lateral epicondyles. Full ROM in wrists and elbows. 5/5 strength with wrist flexion, extension, biceps, and triceps, though pain with resisted triceps testing on the left.  Neurological: He is alert and oriented to person, place, and time. No cranial nerve deficit.  Skin: Skin is warm and dry. No rash noted.  Large hematoma on the extensor surface of the upper arm, 12 cm x 7 cm.  Psychiatric: He has a normal mood and affect. His behavior is normal.  Nursing note and vitals reviewed.         Assessment & Plan:   1. Traumatic hematoma of upper arm, left, subsequent encounter   2. Elbow injury, left, subsequent encounter   Workers comp case from injury on 01/31/16.  Can do light duty without lifting (only <10 lbs) and moderate restrictions on pushing/pulling, no overhead activity. Cont RICE. Recheck 1-2 wks   Meds ordered this encounter  Medications  . meloxicam (MOBIC) 15 MG tablet    Sig: Take 1 tablet (15 mg total) by mouth daily.  Dispense:  30 tablet    Refill:  0    I personally performed the services described in this documentation, which was scribed in my presence. The recorded information has been reviewed and considered, and addended by me as needed.  Delman Cheadle, MD MPH

## 2016-02-10 ENCOUNTER — Telehealth: Payer: Self-pay

## 2016-02-10 NOTE — Telephone Encounter (Signed)
Patient needs FMLA forms completed by Dr Brigitte Pulse. I can not complete them because there are no OV notes from his OV on 01/29/16 and 02/05/16 so I have placed blank FMLA forms in your box on 02/10/16 if you could please fill them out and return them to the FMLA/Disability box at the [employer] checkout desk within 5-7 business days. Thank you!

## 2016-02-29 NOTE — Telephone Encounter (Signed)
These forms are WAY past due have then been completed? I need them returned if so. I placed them in your box on 02/10/16. Please let me know thank you

## 2016-03-04 IMAGING — MR MR HEAD WO/W CM
9 of 12 series · 36 of 48 positions shown · IV contrast (multihance)
Comparison: Head CT 02/27/2015

CLINICAL DATA: Persistent headache. Right-sided head numbness and
pressure behind the eyes. Right arm numbness and tingling. Unsteady
gait. Visual changes. Symptoms present since 02/27/2015.

EXAM:
MRI HEAD WITHOUT AND WITH CONTRAST
TECHNIQUE: Multiplanar, multiecho pulse sequences of the brain and surrounding
structures were obtained without and with intravenous contrast.
CONTRAST:  20mL MULTIHANCE GADOBENATE DIMEGLUMINE 529 MG/ML IV SOLN

[Series 3: FLAIR · sagittal · 5.0mm · 0.47mm/px · 3 of 27 slices shown (1 of 2)]
[im 1/27]
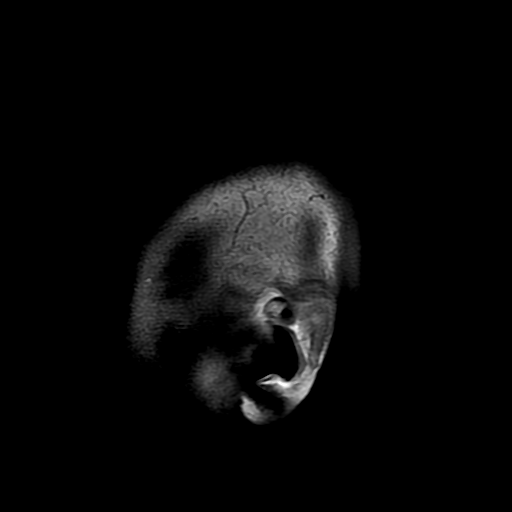
[im 14/27]
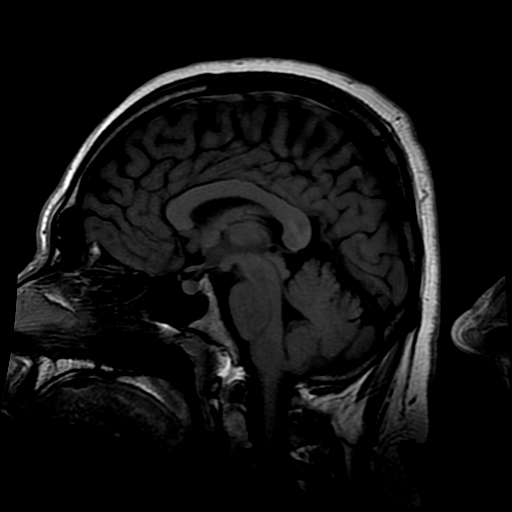
[im 27/27]
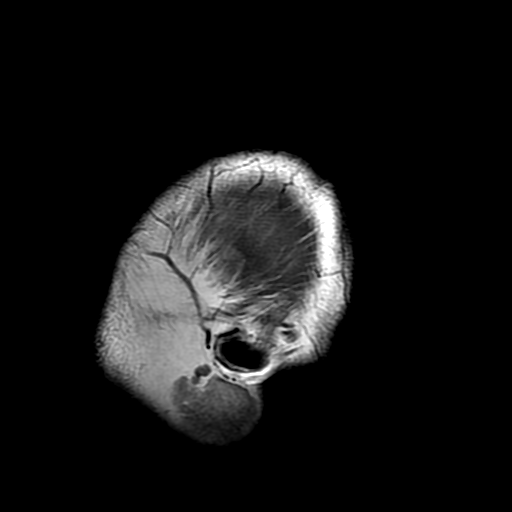

[Series 4: DWI · axial · 3.0mm · 1.09mm/px · z∈[-50,+109]mm · 11 of 110 slices shown (1 of 4)]
[im 1/110]
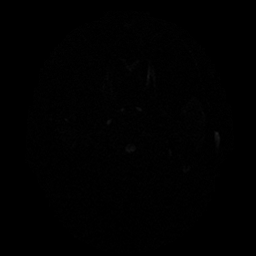
[im 11/110]
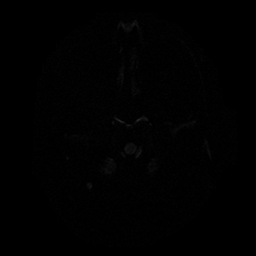
[im 22/110]
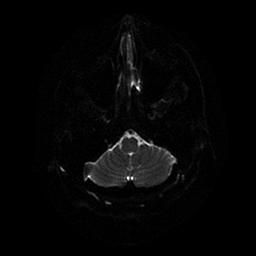
[im 33/110]
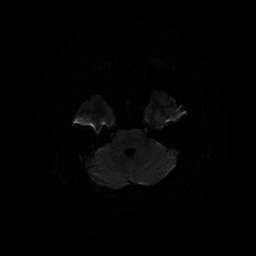
[im 44/110]
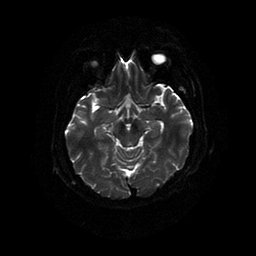
[im 55/110]
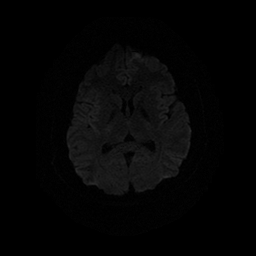
[im 66/110]
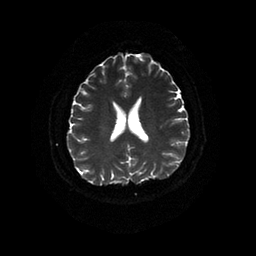
[im 77/110]
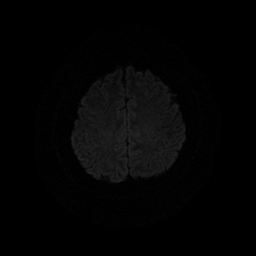
[im 88/110]
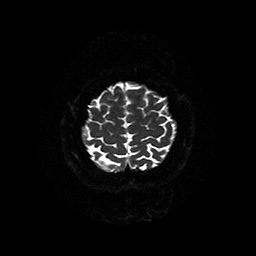
[im 99/110]
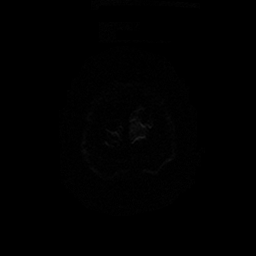
[im 110/110]
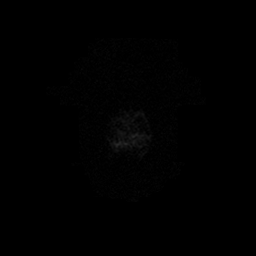

[Series 5: DWI · coronal · 5.0mm · 1.09mm/px · 6 of 66 slices shown (2 of 4)]
[im 1/66]
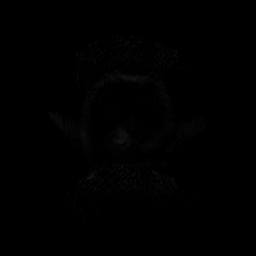
[im 14/66]
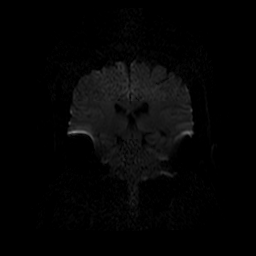
[im 27/66]
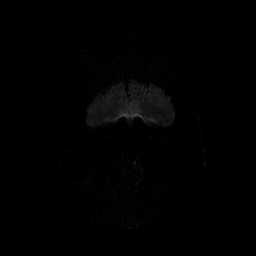
[im 40/66]
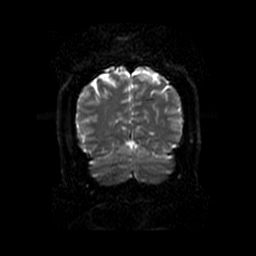
[im 53/66]
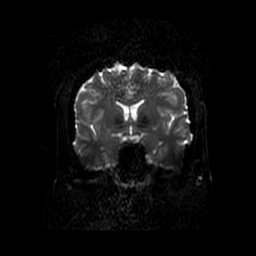
[im 66/66]
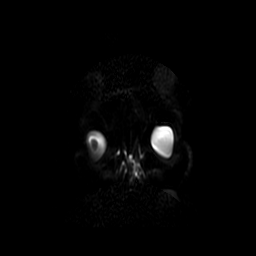

[Series 6: T2-star · axial · 5.0mm · 0.47mm/px · z∈[-46,+102]mm · 2 of 26 slices shown]
[im 1/26]
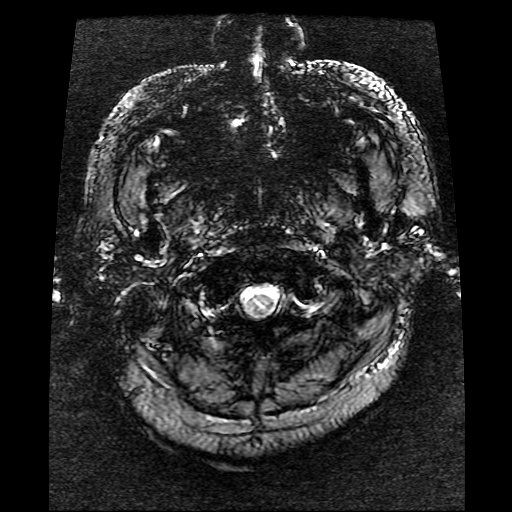
[im 26/26]
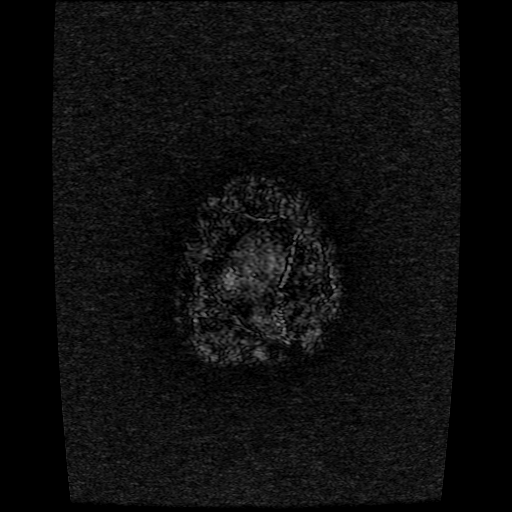

[Series 8: FLAIR · axial · 5.0mm · 0.47mm/px · z∈[-46,+102]mm · 2 of 26 slices shown (2 of 2)]
[im 1/26]
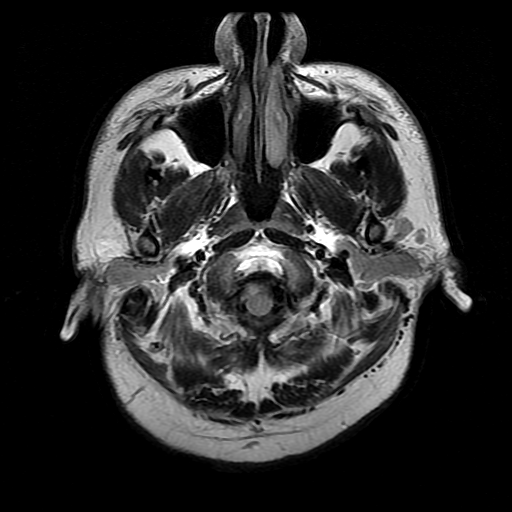
[im 26/26]
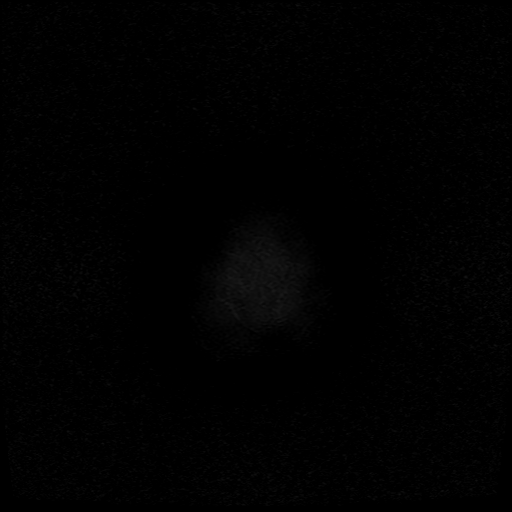

[Series 10: T2 post-contrast · coronal · 5.0mm · 0.47mm/px · 2 of 27 slices shown]
[im 1/27]
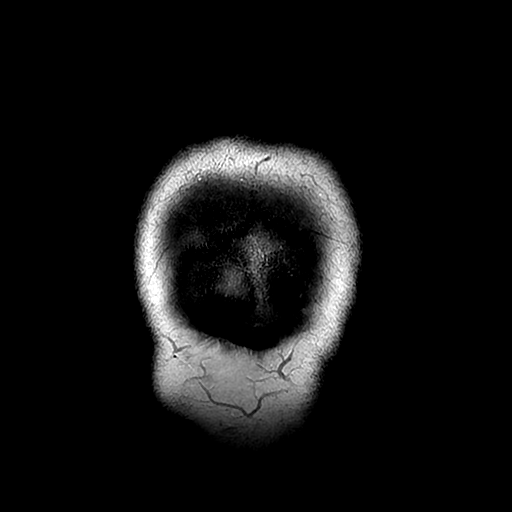
[im 27/27]
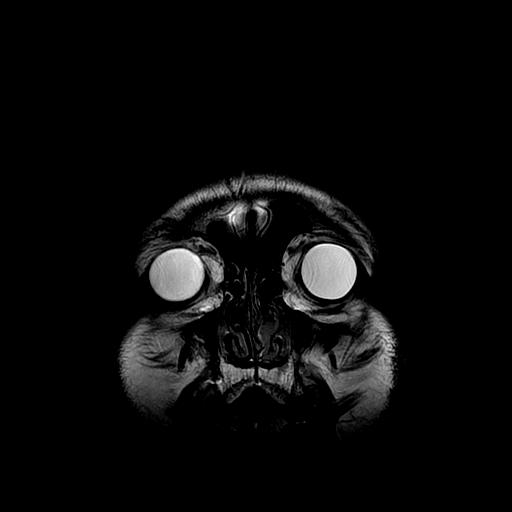

[Series 12: T1 · coronal · 5.0mm · 0.47mm/px · 2 of 27 slices shown]
[im 1/27]
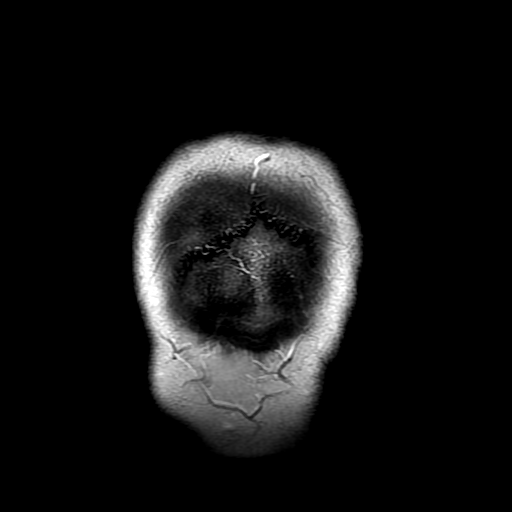
[im 27/27]
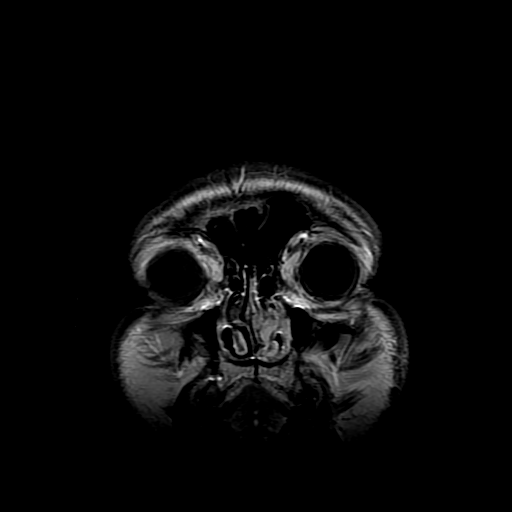

[Series 400: DWI · axial · 3.0mm · 1.09mm/px · z∈[-50,+109]mm · 5 of 55 slices shown (3 of 4)]
[im 1/55]
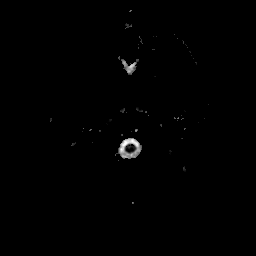
[im 14/55]
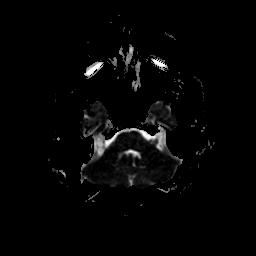
[im 28/55]
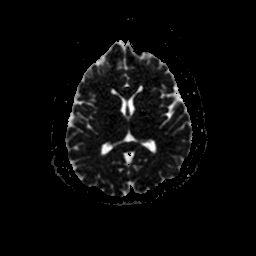
[im 41/55]
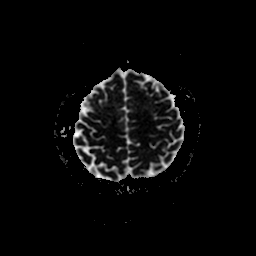
[im 55/55]
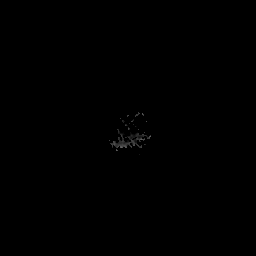

[Series 500: DWI · coronal · 5.0mm · 1.09mm/px · 3 of 33 slices shown (4 of 4)]
[im 1/33]
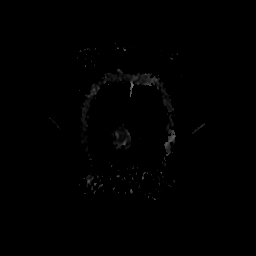
[im 17/33]
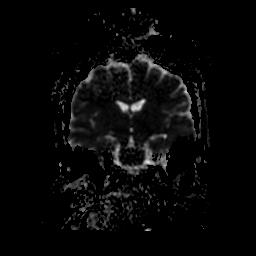
[im 33/33]
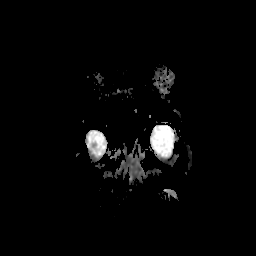

[36 of 48 positions shown; findings below may reference images not displayed]

FINDINGS: There is no acute infarct. Ventricles and sulci are normal for age.
There is no evidence of intracranial hemorrhage, mass, midline
shift, or extra-axial fluid collection. No brain parenchymal signal
abnormality or abnormal enhancement is identified.

Orbits are unremarkable. Minimal right ethmoid air cell mucosal
thickening is noted. Mastoid air cells clear. No suspicious
calvarial lesions are identified. Numerous small upper cervical
lymph nodes are partially visualized and appear symmetric, most
likely benign/reactive.
IMPRESSION: Unremarkable appearance of the brain.

## 2016-03-05 NOTE — Telephone Encounter (Signed)
Completed and returned

## 2016-04-14 DIAGNOSIS — Z113 Encounter for screening for infections with a predominantly sexual mode of transmission: Secondary | ICD-10-CM | POA: Diagnosis not present

## 2016-04-14 DIAGNOSIS — B001 Herpesviral vesicular dermatitis: Secondary | ICD-10-CM | POA: Diagnosis not present

## 2017-03-11 ENCOUNTER — Ambulatory Visit: Payer: 59 | Admitting: Family Medicine

## 2017-11-16 ENCOUNTER — Encounter (HOSPITAL_COMMUNITY): Payer: Self-pay | Admitting: Emergency Medicine

## 2017-11-16 ENCOUNTER — Other Ambulatory Visit: Payer: Self-pay

## 2017-11-16 DIAGNOSIS — R112 Nausea with vomiting, unspecified: Secondary | ICD-10-CM | POA: Diagnosis not present

## 2017-11-16 DIAGNOSIS — Z87891 Personal history of nicotine dependence: Secondary | ICD-10-CM | POA: Diagnosis not present

## 2017-11-16 DIAGNOSIS — Z79899 Other long term (current) drug therapy: Secondary | ICD-10-CM | POA: Diagnosis not present

## 2017-11-16 DIAGNOSIS — K429 Umbilical hernia without obstruction or gangrene: Secondary | ICD-10-CM | POA: Insufficient documentation

## 2017-11-16 LAB — COMPREHENSIVE METABOLIC PANEL
ALT: 45 U/L (ref 17–63)
AST: 28 U/L (ref 15–41)
Albumin: 3.9 g/dL (ref 3.5–5.0)
Alkaline Phosphatase: 101 U/L (ref 38–126)
Anion gap: 10 (ref 5–15)
BUN: 14 mg/dL (ref 6–20)
CALCIUM: 9.3 mg/dL (ref 8.9–10.3)
CHLORIDE: 105 mmol/L (ref 101–111)
CO2: 24 mmol/L (ref 22–32)
CREATININE: 0.73 mg/dL (ref 0.61–1.24)
Glucose, Bld: 124 mg/dL — ABNORMAL HIGH (ref 65–99)
Potassium: 3.5 mmol/L (ref 3.5–5.1)
Sodium: 139 mmol/L (ref 135–145)
Total Bilirubin: 0.2 mg/dL — ABNORMAL LOW (ref 0.3–1.2)
Total Protein: 7.8 g/dL (ref 6.5–8.1)

## 2017-11-16 LAB — CBC
HCT: 44.9 % (ref 39.0–52.0)
Hemoglobin: 14.2 g/dL (ref 13.0–17.0)
MCH: 27.4 pg (ref 26.0–34.0)
MCHC: 31.6 g/dL (ref 30.0–36.0)
MCV: 86.5 fL (ref 78.0–100.0)
PLATELETS: 291 10*3/uL (ref 150–400)
RBC: 5.19 MIL/uL (ref 4.22–5.81)
RDW: 12.6 % (ref 11.5–15.5)
WBC: 10.9 10*3/uL — ABNORMAL HIGH (ref 4.0–10.5)

## 2017-11-16 LAB — LIPASE, BLOOD: LIPASE: 20 U/L (ref 11–51)

## 2017-11-16 NOTE — ED Triage Notes (Signed)
Patient reports emesis, dizziness, and nausea that started today.

## 2017-11-17 ENCOUNTER — Other Ambulatory Visit: Payer: Self-pay

## 2017-11-17 ENCOUNTER — Emergency Department (HOSPITAL_COMMUNITY)
Admission: EM | Admit: 2017-11-17 | Discharge: 2017-11-17 | Disposition: A | Discharge status: Home / Self Care | Payer: 59 | Attending: Emergency Medicine | Admitting: Emergency Medicine

## 2017-11-17 DIAGNOSIS — K429 Umbilical hernia without obstruction or gangrene: Secondary | ICD-10-CM

## 2017-11-17 DIAGNOSIS — R112 Nausea with vomiting, unspecified: Secondary | ICD-10-CM

## 2017-11-17 HISTORY — DX: Dizziness and giddiness: R42

## 2017-11-17 LAB — URINALYSIS, ROUTINE W REFLEX MICROSCOPIC
BILIRUBIN URINE: NEGATIVE
Glucose, UA: NEGATIVE mg/dL
HGB URINE DIPSTICK: NEGATIVE
Ketones, ur: NEGATIVE mg/dL
Leukocytes, UA: NEGATIVE
NITRITE: NEGATIVE
PROTEIN: NEGATIVE mg/dL
Specific Gravity, Urine: 1.027 (ref 1.005–1.030)
pH: 6 (ref 5.0–8.0)

## 2017-11-17 MED ORDER — ONDANSETRON HCL 4 MG PO TABS: 4.0000 mg | ORAL_TABLET | Freq: Four times a day (QID) | ORAL | 0 refills | Status: DC | PRN

## 2017-11-17 MED ORDER — ONDANSETRON 8 MG PO TBDP
8.0000 mg | ORAL_TABLET | Freq: Once | ORAL | Status: AC
  Administered 2017-11-17: 8 mg via ORAL
  Filled 2017-11-17: qty 1

## 2017-11-17 NOTE — ED Provider Notes (Signed)
[facility] EMERGENCY DEPARTMENT Provider Note   CSN: [id number] Arrival date & time: 11/16/17  2211     History   Chief Complaint Chief Complaint  Patient presents with  . Emesis    HPI Arthur Collins is a 27 y.o. male.  The history is provided by the patient.  He has history of GERD, vertigo, obstructive sleep apnea and comes in complaining of nausea throughout the day.  He did vomit once after dinner.  He is feeling slightly lightheaded.  He felt warm at home, but has not had objective fever.  He has not had chills or sweats.  There have been no known sick contacts.  He has not taken anything for this illness at home.  He denies pain anywhere.  There is been no constipation or diarrhea.  As a secondary complaint, he has noted a knot above his umbilicus intermittently over the last several months and is concerned that he might have an umbilical hernia.  He states that umbilical hernias do run in the family members.  Past Medical History:  Diagnosis Date  . Frequent headaches   . GERD (gastroesophageal reflux disease)   . OSA on CPAP 03/11/2015   Dr Lanny Hurst clance.   . Vertigo     Patient Active Problem List   Diagnosis Date Noted  . 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 Chronic fatigue 08/13/2015  . Snoring 08/13/2015  . Complaint of debility and malaise 08/13/2015  . Sleep related headaches 08/13/2015  . 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 Atypical chest pain 06/01/2015  . Obesity 06/01/2015  . Esophageal reflux 06/01/2015  . PND (paroxysmal nocturnal dyspnea) 06/01/2015  . Intermittent palpitations 05/12/2015  . Heart murmur, systolic 29/79/8921  . Intractable migraine with aura with status migrainosus 03/18/2015  . Clamminess 03/11/2015  . Headache behind the eyes 03/11/2015  . Dysesthesia of multiple sites 03/11/2015  . Benign paroxysmal positional vertigo 03/11/2015  . OSA on CPAP 03/11/2015    Past Surgical History:  Procedure Laterality  Date  . FRACTURE SURGERY    . ORIF HUMERUS FRACTURE         Home Medications    Prior to Admission medications   Medication Sig Start Date End Date Taking? Authorizing Provider  esomeprazole (NEXIUM) 20 MG packet Take 20 mg by mouth daily before breakfast. 06/01/15   Jaynee Eagles, PA-C  meloxicam (MOBIC) 15 MG tablet Take 1 tablet (15 mg total) by mouth daily. 02/05/16   Shawnee Knapp, MD  ondansetron (ZOFRAN) 4 MG tablet Take 1 tablet (4 mg total) by mouth every 6 (six) hours as needed. 10/26/39   Delora Fuel, MD    Family History Family History  Problem Relation Age of Onset  . Diabetes Mother   . Hypertension Mother   . Other Father        LIVER ISSUES  . Epilepsy Brother   . Healthy Maternal Grandmother   . Healthy Brother   . Healthy Brother   . Healthy Brother   . Healthy Brother     Social History Social History   Tobacco Use  . Smoking status: Former Smoker    Packs/day: 1.00    Types: Cigarettes  . Smokeless tobacco: Never Used  Substance Use Topics  . Alcohol use: Yes    Alcohol/week: 0.0 oz    Comment: rarely  . Drug use: No     Allergies   Patient has no known allergies.   Review of Systems Review of  Systems  All other systems reviewed and are negative.    Physical Exam Updated Vital Signs BP 118/75 (BP Location: Right Arm)   Pulse 76   Temp 98.2 F (36.8 C) (Oral)   Resp 18   Ht 5\' 9"  (1.753 m)   Wt 122.5 kg (270 lb)   SpO2 98%   BMI 39.87 kg/m   Physical Exam  Nursing note and vitals reviewed.  27 year old male, resting comfortably and in no acute distress. Vital signs are normal. Oxygen saturation is 98%, which is normal. Head is normocephalic and atraumatic. PERRLA, EOMI. Oropharynx is clear. Neck is nontender and supple without adenopathy or JVD. Back is nontender and there is no CVA tenderness. Lungs are clear without rales, wheezes, or rhonchi. Chest is nontender. Heart has regular rate and rhythm without murmur. Abdomen is  soft, flat, nontender without masses or hepatosplenomegaly and peristalsis is hypoactive.  Small umbilical hernia is detected in the superior aspect of the umbilicus. Extremities have no cyanosis or edema, full range of motion is present. Skin is warm and dry without rash. Neurologic: Mental status is normal, cranial nerves are intact, there are no motor or sensory deficits.  ED Treatments / Results  Labs (all labs ordered are listed, but only abnormal results are displayed) Labs Reviewed  COMPREHENSIVE METABOLIC PANEL - Abnormal; Notable for the following components:      Result Value   Glucose, Bld 124 (*)    Total Bilirubin 0.2 (*)    All other components within normal limits  CBC - Abnormal; Notable for the following components:   WBC 10.9 (*)    All other components within normal limits  LIPASE, BLOOD  URINALYSIS, ROUTINE W REFLEX MICROSCOPIC    Procedures Procedures   Medications Ordered in ED Medications  ondansetron (ZOFRAN-ODT) disintegrating tablet 8 mg (not administered)     Initial Impression / Assessment and Plan / ED Course  I have reviewed the triage vital signs and the nursing notes.  Pertinent lab results that were available during my care of the patient were reviewed by me and considered in my medical decision making (see chart for details).  Nausea and vomiting which appear to be viral gastritis.  No red flags to suggest more serious pathology.  Laboratory workup is reassuring.  Small umbilical hernia that probably does not need any intervention at this point.  I have discussed with patient possible referral to general surgery to at least discuss surgical options.  He was given the option of having IV fluids, but prefers to go home.  He is given a dose of ondansetron in the ED and is discharged with prescription for same.  Old records are reviewed, and he has no relevant past visits.  Final Clinical Impressions(s) / ED Diagnoses   Final diagnoses:    Non-intractable vomiting with nausea, unspecified vomiting type  Umbilical hernia without obstruction and without gangrene    ED Discharge Orders        Ordered    ondansetron (ZOFRAN) 4 MG tablet  Every 6 hours PRN     53/97/67 3419       Delora Fuel, MD 37/90/24 [phone]

## 2017-11-17 NOTE — Discharge Instructions (Signed)
Talk with your primary care provider about possible referral to a surgeon about your hernia (I don't think it is necessary at this point).

## 2019-08-27 ENCOUNTER — Other Ambulatory Visit: Payer: Self-pay

## 2019-08-27 DIAGNOSIS — Z20822 Contact with and (suspected) exposure to covid-19: Secondary | ICD-10-CM

## 2019-08-28 ENCOUNTER — Telehealth: Payer: Self-pay

## 2019-08-28 LAB — NOVEL CORONAVIRUS, NAA: SARS-CoV-2, NAA: DETECTED — AB

## 2019-08-28 NOTE — Telephone Encounter (Signed)
Patient called in to confirm his [place] lab results - DOB/Address verified  - confirmed Positive results. Reviewed patient positive test results with patient.   Patient reports the following symptoms: chills; low grade fever: 99.6 and nasal congestions. Patient reports taking the following OTC medications: Day/Night Quil, Mucinex Day/Night, Tylenol and Advil.   Reviewed the [place] for Home Monitoring with patient. Patient reports he does not have presently have a PCP - reviewed how to obtain a PCP with patient. Patient verbalized understanding of all instructions/guidance, no further questions.

## 2020-06-13 ENCOUNTER — Emergency Department (HOSPITAL_COMMUNITY)
Admission: EM | Admit: 2020-06-13 | Discharge: 2020-06-13 | Disposition: A | Discharge status: Home / Self Care | Payer: 59 | Attending: Emergency Medicine | Admitting: Emergency Medicine

## 2020-06-13 ENCOUNTER — Ambulatory Visit
Admission: EM | Admit: 2020-06-13 | Discharge: 2020-06-13 | Disposition: A | Discharge status: Home / Self Care | Payer: 59

## 2020-06-13 ENCOUNTER — Emergency Department (HOSPITAL_COMMUNITY): Payer: 59

## 2020-06-13 ENCOUNTER — Other Ambulatory Visit: Payer: Self-pay

## 2020-06-13 ENCOUNTER — Encounter (HOSPITAL_COMMUNITY): Payer: Self-pay | Admitting: Emergency Medicine

## 2020-06-13 DIAGNOSIS — K219 Gastro-esophageal reflux disease without esophagitis: Secondary | ICD-10-CM | POA: Insufficient documentation

## 2020-06-13 DIAGNOSIS — Z87891 Personal history of nicotine dependence: Secondary | ICD-10-CM | POA: Insufficient documentation

## 2020-06-13 DIAGNOSIS — R110 Nausea: Secondary | ICD-10-CM | POA: Diagnosis not present

## 2020-06-13 DIAGNOSIS — M545 Low back pain, unspecified: Secondary | ICD-10-CM

## 2020-06-13 DIAGNOSIS — R1030 Lower abdominal pain, unspecified: Secondary | ICD-10-CM | POA: Insufficient documentation

## 2020-06-13 DIAGNOSIS — R109 Unspecified abdominal pain: Secondary | ICD-10-CM | POA: Diagnosis present

## 2020-06-13 LAB — COMPREHENSIVE METABOLIC PANEL
ALT: 39 U/L (ref 0–44)
AST: 21 U/L (ref 15–41)
Albumin: 4 g/dL (ref 3.5–5.0)
Alkaline Phosphatase: 99 U/L (ref 38–126)
Anion gap: 8 (ref 5–15)
BUN: 10 mg/dL (ref 6–20)
CO2: 27 mmol/L (ref 22–32)
Calcium: 9.4 mg/dL (ref 8.9–10.3)
Chloride: 103 mmol/L (ref 98–111)
Creatinine, Ser: 0.74 mg/dL (ref 0.61–1.24)
GFR calc Af Amer: >60 mL/min (ref >60)
GFR calc non Af Amer: >60 mL/min (ref >60)
Glucose, Bld: 100 mg/dL — ABNORMAL HIGH (ref 70–99)
Potassium: 3.6 mmol/L (ref 3.5–5.1)
Sodium: 138 mmol/L (ref 135–145)
Total Bilirubin: 0.2 mg/dL — ABNORMAL LOW (ref 0.3–1.2)
Total Protein: 8 g/dL (ref 6.5–8.1)

## 2020-06-13 LAB — CBC WITH DIFFERENTIAL/PLATELET
Abs Immature Granulocytes: 0.04 10*3/uL (ref 0.00–0.07)
Basophils Absolute: 0 10*3/uL (ref 0.0–0.1)
Basophils Relative: 0 %
Eosinophils Absolute: 0.2 10*3/uL (ref 0.0–0.5)
Eosinophils Relative: 2 %
HCT: 44.7 % (ref 39.0–52.0)
Hemoglobin: 14.2 g/dL (ref 13.0–17.0)
Immature Granulocytes: 0 %
Lymphocytes Relative: 25 %
Lymphs Abs: 2.6 10*3/uL (ref 0.7–4.0)
MCH: 27 pg (ref 26.0–34.0)
MCHC: 31.8 g/dL (ref 30.0–36.0)
MCV: 85.1 fL (ref 80.0–100.0)
Monocytes Absolute: 0.9 10*3/uL (ref 0.1–1.0)
Monocytes Relative: 8 %
Neutro Abs: 6.6 10*3/uL (ref 1.7–7.7)
Neutrophils Relative %: 65 %
Platelets: 311 10*3/uL (ref 150–400)
RBC: 5.25 MIL/uL (ref 4.22–5.81)
RDW: 12.9 % (ref 11.5–15.5)
WBC: 10.3 10*3/uL (ref 4.0–10.5)
nRBC: 0 % (ref 0.0–0.2)

## 2020-06-13 LAB — URINALYSIS, ROUTINE W REFLEX MICROSCOPIC
Bilirubin Urine: NEGATIVE
Glucose, UA: NEGATIVE mg/dL
Hgb urine dipstick: NEGATIVE
Ketones, ur: NEGATIVE mg/dL
Leukocytes,Ua: NEGATIVE
Nitrite: NEGATIVE
Protein, ur: NEGATIVE mg/dL
Specific Gravity, Urine: 1.023 (ref 1.005–1.030)
pH: 6 (ref 5.0–8.0)

## 2020-06-13 LAB — LIPASE, BLOOD: Lipase: 20 U/L (ref 11–51)

## 2020-06-13 MED ORDER — IOHEXOL 300 MG/ML  SOLN
100.0000 mL | Freq: Once | INTRAMUSCULAR | Status: AC | PRN
  Administered 2020-06-13: 18:00:00 100 mL via INTRAVENOUS

## 2020-06-13 MED ORDER — METHOCARBAMOL 500 MG PO TABS: 500.0000 mg | ORAL_TABLET | Freq: Three times a day (TID) | ORAL | 0 refills | Status: DC

## 2020-06-13 NOTE — Discharge Instructions (Addendum)
Your blood work, urine test, and CT of your abdomen and pelvis today were reassuring.  We did not see evidence of appendicitis on your CT today.  Your pain may be coming from your lower back.  I would recommend you rest, avoid heavy lifting for several days.  You may take over-the-counter ibuprofen or Aleve as directed for pain if needed.  I have also prescribed a muscle relaxer that may also help with your discomfort.  Follow-up with your primary doctor for recheck, return to the emergency department if you develop any worsening symptoms such as increasing pain, persistent vomiting and fever.

## 2020-06-13 NOTE — ED Triage Notes (Signed)
Pt reports RLQ and LLQ abdominal pain that radiates to his back.

## 2020-06-13 NOTE — ED Provider Notes (Signed)
[facility] EMERGENCY DEPARTMENT Provider Note   CSN: [id number] Arrival date & time: 06/13/20  1549     History Chief Complaint  Patient presents with  . Abdominal Pain    Arthur Collins is a 29 y.o. male.  HPI     Arthur Collins is a 29 y.o. male with past medical history of GERD who presents to the Emergency Department complaining of lower abdominal pain for several days.  He states the pain initially began in the right lower quadrant and radiates in a belt-like pattern around his waist and into his left lower abdomen as well.  He states the pain is dull in quality and constant, but worsens with food intake.  His pain is associated with nausea and occasional loose stools but denies vomiting, fever, chills, cough or chest pain.  No dysuria.  He states that he has had similar pain in the past, but previously the pain improves after 1 day.  He was seen at the local urgent care earlier today and advised to come to the ER for further evaluation.  Patient is concerned that he may have acute appendicitis.    Past Medical History:  Diagnosis Date  . Frequent headaches   . GERD (gastroesophageal reflux disease)   . OSA on CPAP 03/11/2015   Dr Lanny Hurst clance.   . Vertigo     Patient Active Problem List   Diagnosis Date Noted  . 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 Chronic fatigue 08/13/2015  . Snoring 08/13/2015  . Complaint of debility and malaise 08/13/2015  . Sleep related headaches 08/13/2015  . 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 Atypical chest pain 06/01/2015  . Obesity 06/01/2015  . Esophageal reflux 06/01/2015  . PND (paroxysmal nocturnal dyspnea) 06/01/2015  . Intermittent palpitations 05/12/2015  . Heart murmur, systolic 71/69/6789  . Intractable migraine with aura with status migrainosus 03/18/2015  . Clamminess 03/11/2015  . Headache behind the eyes 03/11/2015  . Dysesthesia of multiple sites 03/11/2015  . Benign paroxysmal positional vertigo  03/11/2015  . OSA on CPAP 03/11/2015    Past Surgical History:  Procedure Laterality Date  . FRACTURE SURGERY    . ORIF HUMERUS FRACTURE         Family History  Problem Relation Age of Onset  . Diabetes Mother   . Hypertension Mother   . Other Father        LIVER ISSUES  . Epilepsy Brother   . Healthy Maternal Grandmother   . Healthy Brother   . Healthy Brother   . Healthy Brother   . Healthy Brother     Social History   Tobacco Use  . Smoking status: Former Smoker    Packs/day: 1.00    Types: Cigarettes  . Smokeless tobacco: Never Used  Vaping Use  . Vaping Use: Every day  . Substances: Nicotine, Flavoring  Substance Use Topics  . Alcohol use: Yes    Alcohol/week: 0.0 standard drinks    Comment: rarely  . Drug use: No    Home Medications Prior to Admission medications   Medication Sig Start Date End Date Taking? Authorizing Provider  esomeprazole (NEXIUM) 20 MG packet Take 20 mg by mouth daily before breakfast. 06/01/15   Jaynee Eagles, PA-C  meloxicam (MOBIC) 15 MG tablet Take 1 tablet (15 mg total) by mouth daily. 02/05/16   Shawnee Knapp, MD  ondansetron (ZOFRAN) 4 MG tablet Take 1 tablet (4 mg total) by mouth every 6 (six) hours as  needed. 0/3/54   Delora Fuel, MD    Allergies    Patient has no known allergies.  Review of Systems   Review of Systems  Constitutional: Negative for appetite change, chills and fever.  Respiratory: Negative for cough, chest tightness and shortness of breath.   Cardiovascular: Negative for chest pain.  Gastrointestinal: Positive for abdominal pain and nausea. Negative for blood in stool, diarrhea and vomiting.  Genitourinary: Negative for decreased urine volume, difficulty urinating, dysuria, flank pain, penile pain and testicular pain.  Musculoskeletal: Negative for back pain.  Skin: Negative for color change and rash.  Neurological: Negative for dizziness, syncope, weakness, numbness and headaches.  Hematological: Negative  for adenopathy.    Physical Exam Updated Vital Signs BP 140/80 (BP Location: Right Arm)   Pulse 88   Temp 98.5 F (36.9 C) (Oral)   Resp 20   Ht 5\' 9"  (1.753 m)   Wt (!) 142.9 kg   SpO2 100%   BMI 46.52 kg/m   Physical Exam Vitals and nursing note reviewed.  Constitutional:      General: He is not in acute distress.    Appearance: Normal appearance. He is well-developed. He is not ill-appearing or toxic-appearing.  HENT:     Mouth/Throat:     Mouth: Mucous membranes are moist.  Cardiovascular:     Rate and Rhythm: Normal rate and regular rhythm.     Pulses: Normal pulses.  Pulmonary:     Effort: Pulmonary effort is normal.     Breath sounds: Normal breath sounds.  Abdominal:     General: There is no distension.     Palpations: Abdomen is soft.     Tenderness: There is abdominal tenderness in the right lower quadrant and left lower quadrant. There is no right CVA tenderness, left CVA tenderness or guarding. Negative signs include McBurney's sign.  Musculoskeletal:        General: Normal range of motion.  Skin:    General: Skin is warm.     Capillary Refill: Capillary refill takes less than 2 seconds.     Findings: No rash.  Neurological:     General: No focal deficit present.     Mental Status: He is alert.     Sensory: No sensory deficit.     ED Results / Procedures / Treatments   Labs (all labs ordered are listed, but only abnormal results are displayed) Labs Reviewed  COMPREHENSIVE METABOLIC PANEL - Abnormal; Notable for the following components:      Result Value   Glucose, Bld 100 (*)    Total Bilirubin 0.2 (*)    All other components within normal limits  LIPASE, BLOOD  URINALYSIS, ROUTINE W REFLEX MICROSCOPIC  CBC WITH DIFFERENTIAL/PLATELET    EKG None  Radiology CT ABDOMEN PELVIS W CONTRAST  Result Date: 06/13/2020 CLINICAL DATA:  Right lower quadrant pain. EXAM: CT ABDOMEN AND PELVIS WITH CONTRAST TECHNIQUE: Multidetector CT imaging of the  abdomen and pelvis was performed using the standard protocol following bolus administration of intravenous contrast. CONTRAST:  133mL OMNIPAQUE IOHEXOL 300 MG/ML  SOLN COMPARISON:  July 31, 2014 FINDINGS: Lower chest: No acute abnormality. Hepatobiliary: No focal liver abnormality is seen. No gallstones, gallbladder wall thickening, or biliary dilatation. Pancreas: Unremarkable. No pancreatic ductal dilatation or surrounding inflammatory changes. Spleen: Normal in size without focal abnormality. Adrenals/Urinary Tract: Adrenal glands are unremarkable. Kidneys are normal, without renal calculi, focal lesion, or hydronephrosis. Bladder is unremarkable. Stomach/Bowel: Stomach is within normal limits. Appendix appears normal.  No evidence of bowel wall thickening, distention, or inflammatory changes. Vascular/Lymphatic: No significant vascular findings are present. No enlarged abdominal or pelvic lymph nodes. Reproductive: Prostate is unremarkable. Other: No abdominal wall hernia or abnormality. No abdominopelvic ascites. Musculoskeletal: No acute or significant osseous findings. IMPRESSION: No CT evidence of acute intra-abdominal pathology. Electronically Signed   By: Virgina Norfolk M.D.   On: 06/13/2020 18:08     Procedures Procedures (including critical care time)  Medications Ordered in ED Medications - No data to display  ED Course  I have reviewed the triage vital signs and the nursing notes.  Pertinent labs & imaging results that were available during my care of the patient were reviewed by me and considered in my medical decision making (see chart for details).    MDM Rules/Calculators/A&P                          Patient here with lower abdominal pain, persistent for 3 days.  Pain began in the right lower quadrant and now radiates into his back and into his left lower quadrant.  On my exam, he has minimal tenderness of the right lower quadrant.  No guarding or rebound tenderness.  No  peritoneal signs.  He is well-appearing, vital signs reviewed.  Will obtain labs, urine and CT of the abdomen and pelvis to rule out possible acute appendicitis.   1740  On recheck, pt resting comfortably.  Significant other at bedside.    Labs today are unremarkable, urinalysis reassuring.  CT of the abdomen pelvis shows no acute intra-abdominal pathology, specifically, normal appendix. Doubt emergent process.  Symptoms may be musculoskeletal, patient agrees to treatment plan and close outpatient follow-up.  Strict return precautions were also given.   Final Clinical Impression(s) / ED Diagnoses Final diagnoses:  Lower abdominal pain  Acute low back pain without sciatica, unspecified back pain laterality    Rx / DC Orders ED Discharge Orders    None       Kem Parkinson, PA-C 06/14/20 1444    Noemi Chapel, MD 06/15/20 1122

## 2020-06-13 NOTE — ED Notes (Signed)
Patient is being discharged from the Urgent Care and sent to the Emergency Department via pov . Per Lollie Sails, patient is in need of higher level of care due to abd pain, heart rate . Patient is aware and verbalizes understanding of plan of care. There were no vitals filed for this visit.

## 2020-06-13 NOTE — ED Triage Notes (Signed)
Pt reports RLQ pain and pain to both lower quadrants that radiates to his back.  States that he feels like his heart has had episodes of racing on and off.

## 2020-06-17 HISTORY — PX: LAPAROSCOPIC PARTIAL GASTRECTOMY: SHX1933

## 2020-06-25 ENCOUNTER — Ambulatory Visit
Admission: EM | Admit: 2020-06-25 | Discharge: 2020-06-25 | Disposition: A | Discharge status: Home / Self Care | Payer: 59 | Attending: Emergency Medicine | Admitting: Emergency Medicine

## 2020-06-25 ENCOUNTER — Other Ambulatory Visit: Payer: Self-pay

## 2020-06-25 DIAGNOSIS — H66001 Acute suppurative otitis media without spontaneous rupture of ear drum, right ear: Secondary | ICD-10-CM

## 2020-06-25 MED ORDER — AMOXICILLIN-POT CLAVULANATE 875-125 MG PO TABS: 1.0000 | ORAL_TABLET | Freq: Two times a day (BID) | ORAL | 0 refills | Status: AC

## 2020-06-25 MED ORDER — IBUPROFEN 800 MG PO TABS: 800.0000 mg | ORAL_TABLET | Freq: Three times a day (TID) | ORAL | 0 refills | Status: DC

## 2020-06-25 NOTE — Discharge Instructions (Signed)
Augmentin twice daily for 1 week Use anti-inflammatories for pain/swelling. You may take up to 800 mg Ibuprofen every 8 hours with food. You may supplement Ibuprofen with Tylenol [phone] mg every 8 hours.  Follow up if not improving or worsening

## 2020-06-25 NOTE — ED Provider Notes (Signed)
[facility]    CSN: [id number] Arrival date & time: 06/25/20  1543      History   Chief Complaint Chief Complaint  Patient presents with  . Otalgia    HPI Arthur Collins is a 29 y.o. male presenting today for evaluation of bilateral ear pain.  Patient reports pain began yesterday, initially left worse than right, but today right ear is worse than left.  [place] medicine visit and received ofloxacin drops which has not helped, felt pain worsened after using the drops.  Denies any drainage.  Denies URI symptoms.  HPI  Past Medical History:  Diagnosis Date  . Frequent headaches   . GERD (gastroesophageal reflux disease)   . OSA on CPAP 03/11/2015   Dr Lanny Hurst clance.   . Vertigo     Patient Active Problem List   Diagnosis Date Noted  . 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 Chronic fatigue 08/13/2015  . Snoring 08/13/2015  . Complaint of debility and malaise 08/13/2015  . Sleep related headaches 08/13/2015  . 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 Atypical chest pain 06/01/2015  . Obesity 06/01/2015  . Esophageal reflux 06/01/2015  . PND (paroxysmal nocturnal dyspnea) 06/01/2015  . Intermittent palpitations 05/12/2015  . Heart murmur, systolic 83/41/9622  . Intractable migraine with aura with status migrainosus 03/18/2015  . Clamminess 03/11/2015  . Headache behind the eyes 03/11/2015  . Dysesthesia of multiple sites 03/11/2015  . Benign paroxysmal positional vertigo 03/11/2015  . OSA on CPAP 03/11/2015    Past Surgical History:  Procedure Laterality Date  . FRACTURE SURGERY    . ORIF HUMERUS FRACTURE         Home Medications    Prior to Admission medications   Medication Sig Start Date End Date Taking? Authorizing Provider  amoxicillin-clavulanate (AUGMENTIN) 875-125 MG tablet Take 1 tablet by mouth every 12 (twelve) hours for 7 days. 06/25/20 07/02/20  Tanis Burnley C, PA-C  ibuprofen (ADVIL) 800 MG tablet Take 1 tablet (800 mg  total) by mouth 3 (three) times daily. 06/25/20   Mataya Kilduff C, PA-C  esomeprazole (NEXIUM) 20 MG packet Take 20 mg by mouth daily before breakfast. 06/01/15 06/25/20  Jaynee Eagles, PA-C    Family History Family History  Problem Relation Age of Onset  . Diabetes Mother   . Hypertension Mother   . Other Father        LIVER ISSUES  . Epilepsy Brother   . Healthy Maternal Grandmother   . Healthy Brother   . Healthy Brother   . Healthy Brother   . Healthy Brother     Social History Social History   Tobacco Use  . Smoking status: Former Smoker    Packs/day: 1.00    Types: Cigarettes  . Smokeless tobacco: Never Used  Vaping Use  . Vaping Use: Every day  . Substances: Nicotine, Flavoring  Substance Use Topics  . Alcohol use: Yes    Alcohol/week: 0.0 standard drinks    Comment: rarely  . Drug use: No     Allergies   Patient has no known allergies.   Review of Systems Review of Systems  Constitutional: Negative for activity change, appetite change, chills, fatigue and fever.  HENT: Positive for ear pain. Negative for congestion, rhinorrhea, sinus pressure, sore throat and trouble swallowing.   Eyes: Negative for discharge and redness.  Respiratory: Negative for cough, chest tightness and shortness of breath.   Cardiovascular: Negative for chest pain.  Gastrointestinal: Negative for abdominal  pain, diarrhea, nausea and vomiting.  Musculoskeletal: Negative for myalgias.  Skin: Negative for rash.  Neurological: Negative for dizziness, light-headedness and headaches.     Physical Exam Triage Vital Signs ED Triage Vitals  Enc Vitals Group     BP 06/25/20 1719 113/63     Pulse Rate 06/25/20 1719 83     Resp 06/25/20 1719 20     Temp 06/25/20 1719 98.1 F (36.7 C)     Temp src --      SpO2 06/25/20 1719 97 %     Weight --      Height --      Head Circumference --      Peak Flow --      Pain Score 06/25/20 1717 9     Pain Loc --      Pain Edu? --      Excl. in  [place]? --    No data found.  Updated Vital Signs BP 113/63   Pulse 83   Temp 98.1 F (36.7 C)   Resp 20   SpO2 97%   Visual Acuity Right Eye Distance:   Left Eye Distance:   Bilateral Distance:    Right Eye Near:   Left Eye Near:    Bilateral Near:     Physical Exam Vitals and nursing note reviewed.  Constitutional:      Appearance: He is well-developed.     Comments: No acute distress  HENT:     Head: Normocephalic and atraumatic.     Ears:     Comments: Right external auricle nontender to palpation, tragus nontender, EAC slightly erythematous without swelling or drainage, TM dull bulging and erythematous  Left TM slightly opaque, but no significant erythema, good cone of light and bony landmarks    Nose: Nose normal.     Mouth/Throat:     Comments: Oral mucosa pink and moist, no tonsillar enlargement or exudate. Posterior pharynx patent and nonerythematous, no uvula deviation or swelling. Normal phonation. Eyes:     Conjunctiva/sclera: Conjunctivae normal.  Cardiovascular:     Rate and Rhythm: Normal rate.  Pulmonary:     Effort: Pulmonary effort is normal. No respiratory distress.  Abdominal:     General: There is no distension.  Musculoskeletal:        General: Normal range of motion.     Cervical back: Neck supple.  Skin:    General: Skin is warm and dry.  Neurological:     Mental Status: He is alert and oriented to person, place, and time.      UC Treatments / Results  Labs (all labs ordered are listed, but only abnormal results are displayed) Labs Reviewed - No data to display  EKG   Radiology No results found.  Procedures Procedures (including critical care time)  Medications Ordered in UC Medications - No data to display  Initial Impression / Assessment and Plan / UC Course  I have reviewed the triage vital signs and the nursing notes.  Pertinent labs & imaging results that were available during my care of the patient were reviewed by me  and considered in my medical decision making (see chart for details).     Treating for otitis media with Augmentin, Tylenol and ibuprofen for pain.  Discussed strict return precautions. Patient verbalized understanding and is agreeable with plan.  Final Clinical Impressions(s) / UC Diagnoses   Final diagnoses:  Non-recurrent acute suppurative otitis media of right ear without spontaneous rupture of tympanic membrane  Discharge Instructions     Augmentin twice daily for 1 week Use anti-inflammatories for pain/swelling. You may take up to 800 mg Ibuprofen every 8 hours with food. You may supplement Ibuprofen with Tylenol [phone] mg every 8 hours.  Follow up if not improving or worsening   ED Prescriptions    Medication Sig Dispense Auth. Provider   amoxicillin-clavulanate (AUGMENTIN) 875-125 MG tablet Take 1 tablet by mouth every 12 (twelve) hours for 7 days. 14 tablet Chrisie Jankovich C, PA-C   ibuprofen (ADVIL) 800 MG tablet Take 1 tablet (800 mg total) by mouth 3 (three) times daily. 21 tablet Torrance Frech, Annetta [place] C, PA-C     PDMP not reviewed this encounter.   Janith Lima, [place] 06/25/20 1832

## 2020-06-25 NOTE — ED Triage Notes (Signed)
Pt presents with bilateral ear pain that began yesterday, did tele visit and received drops but made pain worse

## 2020-07-27 ENCOUNTER — Emergency Department (HOSPITAL_COMMUNITY)
Admission: EM | Admit: 2020-07-27 | Discharge: 2020-07-28 | Disposition: A | Discharge status: Home / Self Care | Payer: 59 | Attending: Emergency Medicine | Admitting: Emergency Medicine

## 2020-07-27 ENCOUNTER — Other Ambulatory Visit: Payer: Self-pay

## 2020-07-27 ENCOUNTER — Emergency Department (HOSPITAL_COMMUNITY): Payer: 59

## 2020-07-27 ENCOUNTER — Encounter (HOSPITAL_COMMUNITY): Payer: Self-pay | Admitting: *Deleted

## 2020-07-27 DIAGNOSIS — Z87891 Personal history of nicotine dependence: Secondary | ICD-10-CM | POA: Insufficient documentation

## 2020-07-27 DIAGNOSIS — K219 Gastro-esophageal reflux disease without esophagitis: Secondary | ICD-10-CM | POA: Diagnosis not present

## 2020-07-27 DIAGNOSIS — R1084 Generalized abdominal pain: Secondary | ICD-10-CM | POA: Insufficient documentation

## 2020-07-27 DIAGNOSIS — R197 Diarrhea, unspecified: Secondary | ICD-10-CM | POA: Diagnosis not present

## 2020-07-27 DIAGNOSIS — R109 Unspecified abdominal pain: Secondary | ICD-10-CM | POA: Diagnosis present

## 2020-07-27 DIAGNOSIS — Z9884 Bariatric surgery status: Secondary | ICD-10-CM | POA: Insufficient documentation

## 2020-07-27 LAB — CBC
HCT: 52.1 % — ABNORMAL HIGH (ref 39.0–52.0)
Hemoglobin: 16.4 g/dL (ref 13.0–17.0)
MCH: 26.4 pg (ref 26.0–34.0)
MCHC: 31.5 g/dL (ref 30.0–36.0)
MCV: 83.9 fL (ref 80.0–100.0)
Platelets: 220 10*3/uL (ref 150–400)
RBC: 6.21 MIL/uL — ABNORMAL HIGH (ref 4.22–5.81)
RDW: 13.8 % (ref 11.5–15.5)
WBC: 9.2 10*3/uL (ref 4.0–10.5)
nRBC: 0 % (ref 0.0–0.2)

## 2020-07-27 LAB — COMPREHENSIVE METABOLIC PANEL
ALT: 122 U/L — ABNORMAL HIGH (ref 0–44)
AST: 50 U/L — ABNORMAL HIGH (ref 15–41)
Albumin: 4.7 g/dL (ref 3.5–5.0)
Alkaline Phosphatase: 105 U/L (ref 38–126)
Anion gap: 16 — ABNORMAL HIGH (ref 5–15)
BUN: 12 mg/dL (ref 6–20)
CO2: 22 mmol/L (ref 22–32)
Calcium: 9.8 mg/dL (ref 8.9–10.3)
Chloride: 103 mmol/L (ref 98–111)
Creatinine, Ser: 0.81 mg/dL (ref 0.61–1.24)
GFR, Estimated: >60 mL/min (ref >60)
Glucose, Bld: 86 mg/dL (ref 70–99)
Potassium: 3.5 mmol/L (ref 3.5–5.1)
Sodium: 141 mmol/L (ref 135–145)
Total Bilirubin: 1.1 mg/dL (ref 0.3–1.2)
Total Protein: 9 g/dL — ABNORMAL HIGH (ref 6.5–8.1)

## 2020-07-27 LAB — LIPASE, BLOOD: Lipase: 71 U/L — ABNORMAL HIGH (ref 11–51)

## 2020-07-27 MED ORDER — IOHEXOL 300 MG/ML  SOLN
100.0000 mL | Freq: Once | INTRAMUSCULAR | Status: AC | PRN
  Administered 2020-07-28: 100 mL via INTRAVENOUS

## 2020-07-27 MED ORDER — FENTANYL CITRATE (PF) 100 MCG/2ML IJ SOLN
50.0000 ug | Freq: Once | INTRAMUSCULAR | Status: AC
  Administered 2020-07-27: 50 ug via INTRAVENOUS
  Filled 2020-07-27: qty 2

## 2020-07-27 MED ORDER — LACTATED RINGERS IV BOLUS
1000.0000 mL | Freq: Once | INTRAVENOUS | Status: AC
  Administered 2020-07-27: 1000 mL via INTRAVENOUS

## 2020-07-27 MED ORDER — ONDANSETRON HCL 4 MG/2ML IJ SOLN
4.0000 mg | Freq: Once | INTRAMUSCULAR | Status: AC
  Administered 2020-07-27: 4 mg via INTRAVENOUS
  Filled 2020-07-27: qty 2

## 2020-07-27 NOTE — ED Notes (Signed)
Patient transported to CT 

## 2020-07-27 NOTE — ED Triage Notes (Signed)
Status past gastric bypass. C/o abdominal pain

## 2020-07-28 MED ORDER — ONDANSETRON 4 MG PO TBDP: ORAL_TABLET | ORAL | 0 refills | Status: DC

## 2020-07-28 NOTE — ED Provider Notes (Signed)
[facility] Provider Note   CSN: [id number] Arrival date & time: 07/27/20  1642     History Chief Complaint  Patient presents with  . Abdominal Pain    Arthur Collins is a 29 y.o. male.  Apparently patient had a vertical sleeve gastrectomy done in [place] approximately weeks ago.  He has had significant inability to tolerate p.o. solids since then.  He is unable to drink liquids.  He called the doctor down there and they told him to follow-up to make sure he did not have any leakage. No fever. Diarrhea but no constipation. No other issues. No sick contacts. pcp follow up tomorrow.    Abdominal Pain      Past Medical History:  Diagnosis Date  . Frequent headaches   . GERD (gastroesophageal reflux disease)   . OSA on CPAP 03/11/2015   Dr Lanny Hurst clance.   . Vertigo     Patient Active Problem List   Diagnosis Date Noted  . 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 Chronic fatigue 08/13/2015  . Snoring 08/13/2015  . Complaint of debility and malaise 08/13/2015  . Sleep related headaches 08/13/2015  . 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 Atypical chest pain 06/01/2015  . Obesity 06/01/2015  . Esophageal reflux 06/01/2015  . PND (paroxysmal nocturnal dyspnea) 06/01/2015  . Intermittent palpitations 05/12/2015  . Heart murmur, systolic 79/39/0300  . Intractable migraine with aura with status migrainosus 03/18/2015  . [place]amminess 03/11/2015  . Headache behind the eyes 03/11/2015  . Dysesthesia of multiple sites 03/11/2015  . Benign paroxysmal positional vertigo 03/11/2015  . OSA on CPAP 03/11/2015    Past Surgical History:  Procedure Laterality Date  . FRACTURE SURGERY    . GASTRIC BYPASS    . ORIF HUMERUS FRACTURE         Family History  Problem Relation Age of Onset  . Diabetes Mother   . Hypertension Mother   . Other Father        LIVER ISSUES  . Epilepsy Brother   . Healthy Maternal Grandmother   . Healthy Brother   .  Healthy Brother   . Healthy Brother   . Healthy Brother     Social History   Tobacco Use  . Smoking status: Former Smoker    Packs/day: 1.00    Types: Cigarettes  . Smokeless tobacco: Never Used  Vaping Use  . Vaping Use: Every day  . Substances: Nicotine, Flavoring  Substance Use Topics  . Alcohol use: Yes    Alcohol/week: 0.0 standard drinks    Comment: rarely  . Drug use: No    Home Medications Prior to Admission medications   Medication Sig Start Date End Date Taking? Authorizing Provider  ibuprofen (ADVIL) 800 MG tablet Take 1 tablet (800 mg total) by mouth 3 (three) times daily. 06/25/20   Wieters, Hallie C, PA-C  ondansetron (ZOFRAN ODT) 4 MG disintegrating tablet 4mg  ODT q4 hours prn nausea/vomit 07/28/20   Pavan Bring, Corene Cornea, MD  esomeprazole (NEXIUM) 20 MG packet Take 20 mg by mouth daily before breakfast. 06/01/15 06/25/20  Jaynee Eagles, PA-C    Allergies    Patient has no known allergies.  Review of Systems   Review of Systems  Gastrointestinal: Positive for abdominal pain.  All other systems reviewed and are negative.   Physical Exam Updated Vital Signs BP 137/86 (BP Location: Right Arm)   Pulse 67   Temp 98.5 F (36.9 C) (Oral)   Resp 18  SpO2 100%   Physical Exam Vitals and nursing note reviewed.  Constitutional:      Appearance: He is well-developed.  HENT:     Head: Normocephalic and atraumatic.     Nose: Nose normal. No congestion or rhinorrhea.     Mouth/Throat:     Mouth: Mucous membranes are moist.     Pharynx: Oropharynx is clear.  Eyes:     Pupils: Pupils are equal, round, and reactive to light.  Cardiovascular:     Rate and Rhythm: Normal rate.  Pulmonary:     Effort: Pulmonary effort is normal. No respiratory distress.  Abdominal:     General: Abdomen is flat. There is no distension.  Musculoskeletal:        General: Normal range of motion.     Cervical back: Normal range of motion.  Skin:    General: Skin is warm and dry.      Comments: Laparoscopy scars c/d/i without e/o infection  Neurological:     General: No focal deficit present.     Mental Status: He is alert.     ED Results / Procedures / Treatments   Labs (all labs ordered are listed, but only abnormal results are displayed) Labs Reviewed  LIPASE, BLOOD - Abnormal; Notable for the following components:      Result Value   Lipase 71 (*)    All other components within normal limits  COMPREHENSIVE METABOLIC PANEL - Abnormal; Notable for the following components:   Total Protein 9.0 (*)    AST 50 (*)    ALT 122 (*)    Anion gap 16 (*)    All other components within normal limits  CBC - Abnormal; Notable for the following components:   RBC 6.21 (*)    HCT 52.1 (*)    All other components within normal limits  URINALYSIS, ROUTINE W REFLEX MICROSCOPIC    EKG None  Radiology CT ABDOMEN PELVIS W CONTRAST  Result Date: 07/28/2020 CLINICAL DATA:  Upper abdominal pain. History of gastric sleeve surgery. EXAM: CT ABDOMEN AND PELVIS WITH CONTRAST TECHNIQUE: Multidetector CT imaging of the abdomen and pelvis was performed using the standard protocol following bolus administration of intravenous contrast. CONTRAST:  179mL OMNIPAQUE IOHEXOL 300 MG/ML  SOLN COMPARISON:  06/13/2020 FINDINGS: LOWER CHEST: Normal. HEPATOBILIARY: Normal hepatic contours. No intra- or extrahepatic biliary dilatation. Normal gallbladder. PANCREAS: Normal pancreas. No ductal dilatation or peripancreatic fluid collection. SPLEEN: Normal. ADRENALS/URINARY TRACT: The adrenal glands are normal. No hydronephrosis, nephroureterolithiasis or solid renal mass. The urinary bladder is normal for degree of distention STOMACH/BOWEL: Status post sleeve gastrectomy. No small bowel dilatation or inflammation. No focal colonic abnormality. Normal appendix. VASCULAR/LYMPHATIC: Normal course and caliber of the major abdominal vessels. No abdominal or pelvic lymphadenopathy. REPRODUCTIVE: Normal prostate  size with symmetric seminal vesicles. MUSCULOSKELETAL. No bony spinal canal stenosis or focal osseous abnormality. OTHER: None. IMPRESSION: 1. No acute abnormality of the abdomen or pelvis. 2. Status post sleeve gastrectomy. Electronically Signed   By: Ulyses Jarred M.D.   On: 07/28/2020 00:27    Procedures Procedures (including critical care time)  Medications Ordered in ED Medications  lactated ringers bolus 1,000 mL (0 mLs Intravenous Stopped 07/28/20 0211)  ondansetron (ZOFRAN) injection 4 mg (4 mg Intravenous Given 07/27/20 2355)  fentaNYL (SUBLIMAZE) injection 50 mcg (50 mcg Intravenous Given 07/27/20 2356)  iohexol (OMNIPAQUE) 300 MG/ML solution 100 mL (100 mLs Intravenous Contrast Given 07/28/20 0000)    ED Course  I have reviewed the triage  vital signs and the nursing notes.  Pertinent labs & imaging results that were available during my care of the patient were reviewed by me and considered in my medical decision making (see chart for details).    MDM Rules/Calculators/A&P                          No severe dehydration or obvious complications. Stable for dc.   Final Clinical Impression(s) / ED Diagnoses Final diagnoses:  Generalized abdominal pain    Rx / DC Orders ED Discharge Orders         Ordered    ondansetron (ZOFRAN ODT) 4 MG disintegrating tablet        07/28/20 0127           Corday Wyka, Corene Cornea, MD 07/28/20 [phone]

## 2020-09-07 ENCOUNTER — Other Ambulatory Visit: Payer: Self-pay | Admitting: Family Medicine

## 2020-09-07 DIAGNOSIS — K828 Other specified diseases of gallbladder: Secondary | ICD-10-CM

## 2020-09-22 ENCOUNTER — Ambulatory Visit: Payer: 59 | Admitting: General Surgery

## 2020-10-01 ENCOUNTER — Encounter: Payer: Self-pay | Admitting: General Surgery

## 2020-10-01 ENCOUNTER — Ambulatory Visit (INDEPENDENT_AMBULATORY_CARE_PROVIDER_SITE_OTHER): Payer: 59 | Admitting: General Surgery

## 2020-10-01 ENCOUNTER — Other Ambulatory Visit: Payer: Self-pay

## 2020-10-01 VITALS — BP 104/70 | HR 55 | Temp 98.3°F | Resp 16 | Ht 69.0 in | Wt 239.0 lb

## 2020-10-01 DIAGNOSIS — K828 Other specified diseases of gallbladder: Secondary | ICD-10-CM | POA: Diagnosis not present

## 2020-10-01 DIAGNOSIS — K760 Fatty (change of) liver, not elsewhere classified: Secondary | ICD-10-CM

## 2020-10-01 NOTE — Progress Notes (Signed)
[facility] History and Physical  Reason for Referral: Gallbladder sludge / back pain  Referring Physician:  Dr. Gerarda Fraction, MD   Chief Complaint    New Patient (Initial Visit)      Arthur Collins is a 29 y.o. male.  HPI:  Arthur Collins is a 29 yo who has had laparoscopic gastric sleeve for weight loss 06/2020 in [place]. He reports that he has lost 100 lbs since September. He reports that he had some upper abdominal pain post op and went to the ED and was noted to have no findings on CT but did have very elevated liver function test.   He says that he did not have a good experience in the ED because he was told he would have to return to [place] for his post op complications. He says that since that time he has had improvement in his abdominal symptoms. He is unable to eat much and says he does protein shakes and stays hydrated. He says that he snacks some. He reports no large meals. He says that he has been having some back pain too since the surgery but thinks maybe it is from the weight loss.   He says that he has been evaluated by his PCP and had workup with labs and [place]. He also told them about some chronic hand numbness and is seeing neurology in February. He says that labs were checked for B12 deficiency etc and this was normal.  He denies any pain with food intake or nausea/vomiting now. He had the [place] because of his liver test. He says that he also had the sleeve because he knew he had fatty liver prior to doing the surgery.   Past Medical History:  Diagnosis Date  . Frequent headaches   . GERD (gastroesophageal reflux disease)   . OSA on CPAP 03/11/2015   Dr Lanny Hurst clance.   . Vertigo     Past Surgical History:  Procedure Laterality Date  . FRACTURE SURGERY    . LAPAROSCOPIC PARTIAL GASTRECTOMY  06/2020   Sleeve gastrectomy, [place]  . ORIF HUMERUS FRACTURE      Family History  Problem Relation Age of Onset  . Diabetes Mother   . Hypertension Mother   . Other  Father        LIVER ISSUES  . Epilepsy Brother   . Healthy Maternal Grandmother   . Healthy Brother   . Healthy Brother   . Healthy Brother   . Healthy Brother     Social History   Tobacco Use  . Smoking status: Former Smoker    Packs/day: 1.00    Types: Cigarettes  . Smokeless tobacco: Never Used  Vaping Use  . Vaping Use: Every day  . Substances: Nicotine, Flavoring  Substance Use Topics  . Alcohol use: Yes    Alcohol/week: 0.0 standard drinks    Comment: rarely  . Drug use: No    Medications: I have reviewed the patient's current medications. Allergies as of 10/01/2020   No Known Allergies     Medication List       Accurate as of October 01, 2020 11:59 PM. If you have any questions, ask your nurse or doctor.        STOP taking these medications   ibuprofen 800 MG tablet Commonly known as: ADVIL Stopped by: Virl Cagey, MD   ondansetron 4 MG disintegrating tablet Commonly known as: Zofran ODT Stopped by: Virl Cagey, MD     TAKE  these medications   omeprazole 20 MG capsule Commonly known as: PRILOSEC        ROS:  A comprehensive review of systems was negative except for: Gastrointestinal: positive for reflux symptoms Neurological: positive for chronic hand numbness  Blood pressure 104/70, pulse (!) 55, temperature 98.3 F (36.8 C), temperature source Other (Comment), resp. rate 16, height 5\' 9"  (1.753 m), weight 239 lb (108.4 kg), SpO2 97 %. Physical Exam Vitals reviewed.  Constitutional:      Appearance: He is obese.  HENT:     Head: Normocephalic.     Nose: Nose normal.     Mouth/Throat:     Mouth: Mucous membranes are moist.  Eyes:     Extraocular Movements: Extraocular movements intact.  Cardiovascular:     Rate and Rhythm: Normal rate and regular rhythm.  Pulmonary:     Effort: Pulmonary effort is normal.     Breath sounds: Normal breath sounds.  Abdominal:     General: There is no distension.     Palpations: Abdomen  is soft.     Tenderness: There is no abdominal tenderness.     Comments: Port sites healed, excess skin on abdomen  Musculoskeletal:        General: Normal range of motion.     Cervical back: Normal range of motion.  Skin:    General: Skin is warm.  Neurological:     General: No focal deficit present.     Mental Status: He is alert and oriented to person, place, and time.  Psychiatric:        Mood and Affect: Mood normal.        Behavior: Behavior normal.        Thought Content: Thought content normal.        Judgment: Judgment normal.     Results: CT a/p 07/2020 personally reviewed- no evidence of leak or fluid around sleeve, no bowel dilation or obstruction  CLINICAL DATA:  Upper abdominal pain. History of gastric sleeve surgery.  EXAM: CT ABDOMEN AND PELVIS WITH CONTRAST  TECHNIQUE: Multidetector CT imaging of the abdomen and pelvis was performed using the standard protocol following bolus administration of intravenous contrast.  CONTRAST:  113mL OMNIPAQUE IOHEXOL 300 MG/ML  SOLN  COMPARISON:  06/13/2020  FINDINGS: LOWER CHEST: Normal.  HEPATOBILIARY: Normal hepatic contours. No intra- or extrahepatic biliary dilatation. Normal gallbladder.  PANCREAS: Normal pancreas. No ductal dilatation or peripancreatic fluid collection.  SPLEEN: Normal.  ADRENALS/URINARY TRACT: The adrenal glands are normal. No hydronephrosis, nephroureterolithiasis or solid renal mass. The urinary bladder is normal for degree of distention  STOMACH/BOWEL: Status post sleeve gastrectomy. No small bowel dilatation or inflammation. No focal colonic abnormality. Normal appendix.  VASCULAR/LYMPHATIC: Normal course and caliber of the major abdominal vessels. No abdominal or pelvic lymphadenopathy.  REPRODUCTIVE: Normal prostate size with symmetric seminal vesicles.  MUSCULOSKELETAL. No bony spinal canal stenosis or focal osseous abnormality.  OTHER:  None.  IMPRESSION: 1. No acute abnormality of the abdomen or pelvis. 2. Status post sleeve gastrectomy.   Electronically Signed   By: Ulyses Jarred M.D.   On: 07/28/2020 00:27  [facility] ED labs 07/2020 Results for Arthur Collins, Arthur Collins (MRN [id number]) as of 10/02/2020 09:17  Ref. Range 07/27/2020 21:06  Sodium Latest Ref Range: 135 - 145 mmol/L 141  Potassium Latest Ref Range: 3.5 - 5.1 mmol/L 3.5  Chloride Latest Ref Range: 98 - 111 mmol/L 103  CO2 Latest Ref Range: 22 - 32 mmol/L 22  Glucose Latest  Ref Range: 70 - 99 mg/dL 86  BUN Latest Ref Range: 6 - 20 mg/dL 12  Creatinine Latest Ref Range: 0.61 - 1.24 mg/dL 0.81  Calcium Latest Ref Range: 8.9 - 10.3 mg/dL 9.8  Anion gap Latest Ref Range: 5 - 15  16 (H)  Alkaline Phosphatase Latest Ref Range: 38 - 126 U/L 105  Albumin Latest Ref Range: 3.5 - 5.0 g/dL 4.7  Lipase Latest Ref Range: 11 - 51 U/L 71 (H)  AST Latest Ref Range: 15 - 41 U/L 50 (H)  ALT Latest Ref Range: 0 - 44 U/L 122 (H)  Total Protein Latest Ref Range: 6.5 - 8.1 g/dL 9.0 (H)  Total Bilirubin Latest Ref Range: 0.3 - 1.2 mg/dL 1.1  GFR, Estimated Latest Ref Range: >60 mL/min >60  WBC Latest Ref Range: 4.0 - 10.5 K/uL 9.2  RBC Latest Ref Range: 4.22 - 5.81 MIL/uL 6.21 (H)  Hemoglobin Latest Ref Range: 13.0 - 17.0 g/dL 16.4  HCT Latest Ref Range: 39.0 - 52.0 % 52.1 (H)  MCV Latest Ref Range: 80.0 - 100.0 fL 83.9  MCH Latest Ref Range: 26.0 - 34.0 pg 26.4  MCHC Latest Ref Range: 30.0 - 36.0 g/dL 31.5  RDW Latest Ref Range: 11.5 - 15.5 % 13.8  Platelets Latest Ref Range: 150 - 400 K/uL 220  nRBC Latest Ref Range: 0.0 - 0.2 % 0.0   OSH Labs/ [place]              Assessment & Plan:  Arthur Collins is a 29 y.o. male with signs of fatty liver on his [place] and AST/ ALT elevation without any bilirubin or alkaline phosphatase elevation.  He does have reported sludge on the [place] but does not have any real gallbladder type symptoms. He has lost a significant amount  of weight since his gastric sleeve and reports mostly back pain now. He is staying hydrated and taking in protein.   Discussed the patient with a bariatric surgeon colleague and they said that 100 lbs in 3 months is a lot of weight loss and that is likely why he developed sludge because of losing weight in a short period of time.  She said that other thing would be ulcer or gastritis.  He should be able to undergo laparoscopic cholecystectomy without issues.   Will refer to GI for fatty liver disease for further evaluation and monitoring. Will see back in a few weeks to see how he is doing and to get him to record a symptom diary with regard to any pain or no pain with food intake over the holidays.    Future Appointments  Date Time Provider [facility]  10/27/2020  9:45 AM Virl Cagey, MD [facility] None  11/25/2020  2:00 PM Dohmeier, Asencion Partridge, MD [facility] None  01/26/2021  3:15 PM Rehman, Mechele Dawley, MD [facility] None    All questions were answered to the satisfaction of the patient.   Virl Cagey 10/02/2020, 5:21 PM

## 2020-10-01 NOTE — Patient Instructions (Signed)
Referral to GI for Fatty Liver disease/ elevated LFTs. Keep Food Diary about any Abdominal pain related to food/ fatty foods.   Fatty Liver Disease  Fatty liver disease occurs when too much fat has built up in your liver cells. Fatty liver disease is also called hepatic steatosis or steatohepatitis. The liver removes harmful substances from your bloodstream and produces fluids that your body needs. It also helps your body use and store energy from the food you eat. In many cases, fatty liver disease does not cause symptoms or problems. It is often diagnosed when tests are being done for other reasons. However, over time, fatty liver can cause inflammation that may lead to more serious liver problems, such as scarring of the liver (cirrhosis) and liver failure. Fatty liver is associated with insulin resistance, increased body fat, high blood pressure (hypertension), and high cholesterol. These are features of metabolic syndrome and increase your risk for stroke, diabetes, and heart disease. What are the causes? This condition may be caused by:  Drinking too much alcohol.  Poor nutrition.  Obesity.  Cushing's syndrome.  Diabetes.  High cholesterol.  Certain drugs.  Poisons.  Some viral infections.  Pregnancy. What increases the risk? You are more likely to develop this condition if you:  Abuse alcohol.  Are overweight.  Have diabetes.  Have hepatitis.  Have a high triglyceride level.  Are pregnant. What are the signs or symptoms? Fatty liver disease often does not cause symptoms. If symptoms do develop, they can include:  Fatigue.  Weakness.  Weight loss.  Confusion.  Abdominal pain.  Nausea and vomiting.  Yellowing of your skin and the white parts of your eyes (jaundice).  Itchy skin. How is this diagnosed? This condition may be diagnosed by:  A physical exam and medical history.  Blood tests.  Imaging tests, such as an ultrasound, CT scan, or  MRI.  A liver biopsy. A small sample of liver tissue is removed using a needle. The sample is then looked at under a microscope. How is this treated? Fatty liver disease is often caused by other health conditions. Treatment for fatty liver may involve medicines and lifestyle changes to manage conditions such as:  Alcoholism.  High cholesterol.  Diabetes.  Being overweight or obese. Follow these instructions at home:   Do not drink alcohol. If you have trouble quitting, ask your health care provider how to safely quit with the help of medicine or a supervised program. This is important to keep your condition from getting worse.  Eat a healthy diet as told by your health care provider. Ask your health care provider about working with a diet and nutrition specialist (dietitian) to develop an eating plan.  Exercise regularly. This can help you lose weight and control your cholesterol and diabetes. Talk to your health care provider about an exercise plan and which activities are best for you.  Take over-the-counter and prescription medicines only as told by your health care provider.  Keep all follow-up visits as told by your health care provider. This is important. Contact a health care provider if: You have trouble controlling your:  Blood sugar. This is especially important if you have diabetes.  Cholesterol.  Drinking of alcohol. Get help right away if:  You have abdominal pain.  You have jaundice.  You have nausea and vomiting.  You vomit blood or material that looks like coffee grounds.  You have stools that are black, tar-like, or bloody. Summary  Fatty liver disease develops when  too much fat builds up in the cells of your liver.  Fatty liver disease often causes no symptoms or problems. However, over time, fatty liver can cause inflammation that may lead to more serious liver problems, such as scarring of the liver (cirrhosis).  You are more likely to develop  this condition if you abuse alcohol, are pregnant, are overweight, have diabetes, have hepatitis, or have high triglyceride levels.  Contact your health care provider if you have trouble controlling your weight, blood sugar, cholesterol, or drinking of alcohol. This information is not intended to replace advice given to you by your health care provider. Make sure you discuss any questions you have with your health care provider. Document Revised: 09/15/2017 Document Reviewed: 07/12/2017 Elsevier Patient Education  2020 [employer].    Cholelithiasis (Sludge is very tiny/ gravel/ sand stones)   Cholelithiasis is also called "gallstones." It is a kind of gallbladder disease. The gallbladder is an organ that stores a liquid (bile) that helps you digest fat. Gallstones may not cause symptoms (may be silent gallstones) until they cause a blockage, and then they can cause pain (gallbladder attack). Follow these instructions at home:  Take over-the-counter and prescription medicines only as told by your doctor.  Stay at a healthy weight.  Eat healthy foods. This includes: ? Eating fewer fatty foods, like fried foods. ? Eating fewer refined carbs (refined carbohydrates). Refined carbs are breads and grains that are highly processed, like white bread and white rice. Instead, choose whole grains like whole-wheat bread and brown rice. ? Eating more fiber. Almonds, fresh fruit, and beans are healthy sources of fiber.  Keep all follow-up visits as told by your doctor. This is important. Contact a doctor if:  You have sudden pain in the upper right side of your belly (abdomen). Pain might spread to your right shoulder or your chest. This may be a sign of a gallbladder attack.  You feel sick to your stomach (are nauseous).  You throw up (vomit).  You have been diagnosed with gallstones that have no symptoms and you get: ? Belly pain. ? Discomfort, burning, or fullness in the upper part of your  belly (indigestion). Get help right away if:  You have sudden pain in the upper right side of your belly, and it lasts for more than 2 hours.  You have belly pain that lasts for more than 5 hours.  You have a fever or chills.  You keep feeling sick to your stomach or you keep throwing up.  Your skin or the whites of your eyes turn yellow (jaundice).  You have dark-colored pee (urine).  You have light-colored poop (stool). Summary  Cholelithiasis is also called "gallstones."  The gallbladder is an organ that stores a liquid (bile) that helps you digest fat.  Silent gallstones are gallstones that do not cause symptoms.  A gallbladder attack may cause sudden pain in the upper right side of your belly. Pain might spread to your right shoulder or your chest. If this happens, contact your doctor.  If you have sudden pain in the upper right side of your belly that lasts for more than 2 hours, get help right away. This information is not intended to replace advice given to you by your health care provider. Make sure you discuss any questions you have with your health care provider. Document Revised: 09/15/2017 Document Reviewed: 06/19/2016 Elsevier Patient Education  2020 [employer].

## 2020-10-02 ENCOUNTER — Encounter (INDEPENDENT_AMBULATORY_CARE_PROVIDER_SITE_OTHER): Payer: Self-pay | Admitting: *Deleted

## 2020-10-07 ENCOUNTER — Telehealth: Payer: Self-pay | Admitting: Family Medicine

## 2020-10-07 NOTE — Telephone Encounter (Signed)
Pt called and states that he is having some pain ?from gallbladder, he states that he is also having some nausea. He has taken no medication for either symptom. He states that his side hurts worse with lying on it and he was having trouble sleeping last night if her laid on that side. Thinks he may have a low grade fever but has not checked his temp. Recommended pt take Ibuprofen for the pain and it will also help if he actually has a fever and if that does not help with the pain as he rates it at a 6 then he will need to contact his PCP or go to ER. Pt verbalizes understanding.

## 2020-10-27 ENCOUNTER — Ambulatory Visit: Payer: 59 | Admitting: General Surgery

## 2020-11-25 ENCOUNTER — Encounter: Payer: Self-pay | Admitting: Neurology

## 2020-11-25 ENCOUNTER — Telehealth: Payer: Self-pay | Admitting: Neurology

## 2020-11-25 ENCOUNTER — Ambulatory Visit: Payer: 59 | Admitting: Neurology

## 2020-11-25 NOTE — Telephone Encounter (Signed)
Pt was a no show to the new patient visit that was scheduled for today.

## 2021-01-26 ENCOUNTER — Ambulatory Visit (INDEPENDENT_AMBULATORY_CARE_PROVIDER_SITE_OTHER): Payer: 59 | Admitting: Internal Medicine

## 2021-01-26 ENCOUNTER — Encounter (INDEPENDENT_AMBULATORY_CARE_PROVIDER_SITE_OTHER): Payer: Self-pay | Admitting: Internal Medicine

## 2021-02-11 ENCOUNTER — Encounter (HOSPITAL_COMMUNITY): Payer: Self-pay | Admitting: *Deleted

## 2021-02-11 ENCOUNTER — Other Ambulatory Visit: Payer: Self-pay

## 2021-02-11 ENCOUNTER — Emergency Department (HOSPITAL_COMMUNITY)
Admission: EM | Admit: 2021-02-11 | Discharge: 2021-02-11 | Disposition: A | Discharge status: Home / Self Care | Payer: 59 | Attending: Emergency Medicine | Admitting: Emergency Medicine

## 2021-02-11 ENCOUNTER — Emergency Department (HOSPITAL_COMMUNITY): Payer: 59

## 2021-02-11 DIAGNOSIS — R531 Weakness: Secondary | ICD-10-CM | POA: Diagnosis not present

## 2021-02-11 DIAGNOSIS — Z87891 Personal history of nicotine dependence: Secondary | ICD-10-CM | POA: Diagnosis not present

## 2021-02-11 DIAGNOSIS — R269 Unspecified abnormalities of gait and mobility: Secondary | ICD-10-CM | POA: Insufficient documentation

## 2021-02-11 DIAGNOSIS — R29898 Other symptoms and signs involving the musculoskeletal system: Secondary | ICD-10-CM

## 2021-02-11 NOTE — ED Provider Notes (Signed)
[facility] EMERGENCY DEPARTMENT Provider Note   CSN: [id number] Arrival date & time: 02/11/21  1154     History Chief Complaint  Patient presents with  . Extremity Weakness    Arthur Collins is a 30 y.o. male who presents today for evaluation of right leg weakness for 6 days.  He states that he first noticed that when he was walking his right foot was slapping the ground.  He then tripped up a set of stairs which is abnormal for him and realized that his right foot was weak.  He states that the weakness has gotten worse since it started.  He denies any trauma.  He denies any headache or back pain.  No history of IV drug use. He was seen in 2016 and evaluated by neurology for right-sided numbness.  He did not reportedly have weakness at this time.  He states no cause was found. Chart review shows he had a normal MRI brain at that time.  Currently he denies any vision changes.  No fevers or fatigue.    HPI     Past Medical History:  Diagnosis Date  . Frequent headaches   . GERD (gastroesophageal reflux disease)   . OSA on CPAP 03/11/2015   Dr Lanny Hurst clance.   . Vertigo     Patient Active Problem List   Diagnosis Date Noted  . Sludge in gallbladder 10/01/2020  . Fatty liver 10/01/2020  . 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 Chronic fatigue 08/13/2015  . Snoring 08/13/2015  . Complaint of debility and malaise 08/13/2015  . Sleep related headaches 08/13/2015  . 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 Atypical chest pain 06/01/2015  . Obesity 06/01/2015  . Esophageal reflux 06/01/2015  . PND (paroxysmal nocturnal dyspnea) 06/01/2015  . Intermittent palpitations 05/12/2015  . Heart murmur, systolic [id number]  . Intractable migraine with aura with status migrainosus 03/18/2015  . Clamminess 03/11/2015  . Headache behind the eyes 03/11/2015  . Dysesthesia of multiple sites 03/11/2015  . Benign paroxysmal positional vertigo 03/11/2015  . OSA on CPAP 03/11/2015     Past Surgical History:  Procedure Laterality Date  . FRACTURE SURGERY    . LAPAROSCOPIC PARTIAL GASTRECTOMY  06/2020   Sleeve gastrectomy, [place]  . ORIF HUMERUS FRACTURE         Family History  Problem Relation Age of Onset  . Diabetes Mother   . Hypertension Mother   . Other Father        LIVER ISSUES  . Epilepsy Brother   . Healthy Maternal Grandmother   . Healthy Brother   . Healthy Brother   . Healthy Brother   . Healthy Brother     Social History   Tobacco Use  . Smoking status: Former Smoker    Packs/day: 1.00    Types: Cigarettes  . Smokeless tobacco: Never Used  Vaping Use  . Vaping Use: Every day  . Substances: Nicotine, Flavoring  Substance Use Topics  . Alcohol use: Yes    Alcohol/week: 0.0 standard drinks    Comment: rarely  . Drug use: No    Home Medications Prior to Admission medications   Medication Sig Start Date End Date Taking? Authorizing Provider  esomeprazole (NEXIUM) 20 MG packet Take 20 mg by mouth daily before breakfast. 06/01/15 06/25/20  Jaynee Eagles, PA-C    Allergies    Patient has no known allergies.  Review of Systems   Review of Systems  Constitutional: Negative for chills and fever.  Respiratory: Negative for cough and shortness of breath.   Cardiovascular: Negative for chest pain and leg swelling.  Gastrointestinal: Negative for abdominal pain, diarrhea, nausea and vomiting.  Neurological: Positive for weakness and numbness.       Both in right foot.   All other systems reviewed and are negative.   Physical Exam Updated Vital Signs BP 109/70 (BP Location: Left Arm)   Pulse (!) 53   Temp 98.6 F (37 C) (Oral)   Resp 16   Ht 5\' 9"  (1.753 m)   SpO2 100%   BMI 35.29 kg/m   Physical Exam Vitals and nursing note reviewed.  Constitutional:      General: He is not in acute distress.    Appearance: He is not diaphoretic.  HENT:     Head: Normocephalic and atraumatic.  Eyes:     General: No scleral icterus.        Right eye: No discharge.        Left eye: No discharge.     Conjunctiva/sclera: Conjunctivae normal.  Cardiovascular:     Rate and Rhythm: Normal rate and regular rhythm.     Pulses: Normal pulses.     Heart sounds: Normal heart sounds.  Pulmonary:     Effort: Pulmonary effort is normal. No respiratory distress.     Breath sounds: Normal breath sounds. No stridor.  Abdominal:     General: Abdomen is flat. There is no distension.     Tenderness: There is no abdominal tenderness. There is no guarding.  Musculoskeletal:        General: No deformity.     Cervical back: Normal range of motion.     Right lower leg: No edema.     Left lower leg: No edema.  Skin:    General: Skin is warm and dry.  Neurological:     Mental Status: He is alert.     Motor: No abnormal muscle tone.     Comments: Patient is awake and alert, answers questions appropriately.  Speech is nonslurred, no aphasia.  Facial movements are symmetric bilaterally.  5/5 strength bilateral upper extremities. Left lower extremity with 5/5 strength through ankle dorsiflexion, plantarflexion, knee flexion/extension and hip flexion/extension.  Right lower extremity has intact strength through hip and knee, however has weakness with dorsiflexion. Plantar flexion is intact.  Bilateral patellar reflexes present. Gait is abnormal.    Psychiatric:        Mood and Affect: Mood normal.        Behavior: Behavior normal.     ED Results / Procedures / Treatments   Labs (all labs ordered are listed, but only abnormal results are displayed) Labs Reviewed - No data to display  EKG EKG Interpretation  Date/Time:  Thursday February 11 2021 13:58:57 EDT Ventricular Rate:  51 PR Interval:  132 QRS Duration: 107 QT Interval:  419 QTC Calculation: 386 R Axis:   79 Text Interpretation: Sinus rhythm No significant change since last tracing Confirmed by Davonna Belling [phone]) on 02/11/2021 2:06:05 PM   Radiology MR LUMBAR SPINE WO  CONTRAST  Result Date: 02/11/2021 CLINICAL DATA:  Demyelinating disease; possible. Isolated right foot drop for 6 days. EXAM: MRI LUMBAR SPINE WITHOUT CONTRAST TECHNIQUE: Multiplanar, multisequence MR imaging of the lumbar spine was performed. No intravenous contrast was administered. COMPARISON:  CT abdomen/pelvis 07/28/2020 FINDINGS: Segmentation: 5 lumbar vertebrae. The caudal most well-formed intervertebral disc space is designated L5-S1. Alignment: Straightening of the expected lumbar lordosis. No significant spondylolisthesis. Vertebrae: Subtle  chronic anterior wedging of the T12 vertebral body. Vertebral body height is otherwise maintained. Small L4 superior endplate Schmorl node. No significant marrow edema or focal suspicious osseous lesion. Conus medullaris and cauda equina: Conus extends to the L1 level. No signal abnormality within the visualized distal spinal cord. Paraspinal and other soft tissues: No abnormality identified within included portions of the abdomen/retroperitoneum. Paraspinal soft tissues within normal limits. Disc levels: Intervertebral disc height and hydration are maintained. Mild facet arthrosis throughout the lumbar spine. No significant disc herniation, spinal canal stenosis or neural foraminal narrowing. IMPRESSION: No signal abnormality within the visualized distal spinal cord. Mild facet arthrosis throughout the lumbar spine. No significant disc herniation, spinal canal stenosis or neural foraminal narrowing. Subtle chronic anterior wedge deformity of the T12 vertebral body. Electronically Signed   By: Kellie Simmering DO   On: 02/11/2021 15:14    Procedures Procedures   Medications Ordered in ED Medications - No data to display  ED Course  I have reviewed the triage vital signs and the nursing notes.  Pertinent labs & imaging results that were available during my care of the patient were reviewed by me and considered in my medical decision making (see chart for  details).  Clinical Course as of 02/11/21 1839  Thu Feb 11, 2021  1331 I spoke with Dr, Leonel Ramsay of neurology, recommended L spine MRI.  If normal needs to see neurology for EMG.  [EH]  1359 I received a call from neurology stating that the machine is down for updates and will not be back until 6:54 PM tonight. [EH]  1413 I spoke with patient, and with his permission his husband who is also in the room.  We discussed that MRI he is down.  We discussed options for outpatient MRI possibly tomorrow.  We did discuss the possibility for worsening condition in the meantime, however I consider this unlikely given that his symptoms have been going on for 5 days now. [EH]  1428 MRI called, they are now able to do the scan.  [EH]    Clinical Course User Index [EH] Ollen Gross   MDM Rules/Calculators/A&P                         Patient is a otherwise healthy 30 year old man who presents today for evaluation of right foot drop.  Over the past 5 to 6 days he has had weakness in his right foot. On exam he is able to invert and evert his foot and has intact right leg plantar flexion however no dorsiflexion.  He also reports subjective decrease sensation to light touch. No headache, chest or back pain.  No history of cancer or IV drug use, he denies any recent trauma. I spoke with Dr. Leonel Ramsay with neurology to discuss imaging options, plan to obtain L-spine imaging. Patient does report he had a similar event in 2016 however then it was the entire right side of his body, chart review shows at that point he had a echo which was reassuring, and a brain MRI that was also reassuring without cause for his symptoms found.   MRI L-spine obtained without cause for patient's symptoms. I suspect that patient may have a distal neuropathy such as a peroneal nerve neuropathy, however given that this is not the first time this is happened he does need to see neurology.  We discussed the need for  neurology follow-up and he states his understanding.  Return precautions were  discussed with patient who states their understanding.  At the time of discharge patient denied any unaddressed complaints or concerns.  Patient is agreeable for discharge home.  Note: Portions of this report may have been transcribed using voice recognition software. Every effort was made to ensure accuracy; however, inadvertent computerized transcription errors may be present  Final Clinical Impression(s) / ED Diagnoses Final diagnoses:  Weakness of right lower extremity    Rx / DC Orders ED Discharge Orders         Ordered    MR LUMBAR SPINE WO CONTRAST  Status:  Canceled        02/11/21 1417    Ambulatory referral to Neurology       Comments: An appointment is requested in approximately: 1 week   02/11/21 1417           Ollen Gross 02/11/21 Hilliard Clark, MD 02/13/21 1450

## 2021-02-11 NOTE — ED Notes (Signed)
Pt in bed, pt appears to have st elevation on the monitor, ekg complete, pt appears to be brady with st elevation in lead two, but not in 3 or avf, ekg given to md who states that it appears ok and no further orders or needed.  Pt denies chest pain, denies shortness of breath.  Per PA ekg is similar/ unchanged from previous.

## 2021-02-11 NOTE — Discharge Instructions (Addendum)
Today your MRI was reasuring.    As we discussed if you have any worsening symptoms then I would recommend that you go to [facility] in [place] or [place] as [facility] has the most consistent MRI schedule and has neurology in-house 24/7 if needed.  I have given you some information to read on foot drop from nerve compression.  This is 1 possible cause of your symptoms.

## 2021-02-11 NOTE — ED Notes (Signed)
Pt in bed, pt states that he started having some weakness in his R foot a few days ago, pt describes something like drop foot, pt can lift leg, pt has equal sensation to touch bilaterally, denies slurred speech or facial droop, pt awake and oriented.

## 2021-02-11 NOTE — ED Triage Notes (Signed)
Right leg weakness for 6 days, sent from [facility] for evaluation, weakness has gotten worse

## 2021-02-17 ENCOUNTER — Ambulatory Visit (INDEPENDENT_AMBULATORY_CARE_PROVIDER_SITE_OTHER): Payer: 59 | Admitting: Gastroenterology

## 2021-04-14 ENCOUNTER — Telehealth: Payer: Self-pay | Admitting: Neurology

## 2021-04-14 NOTE — Telephone Encounter (Signed)
Please do not reschedule with me- he needs a neuro-muscular NCV/ EMG trained neurologist nad he no showed 12-09-2020 with me. Can see first available. CD

## 2021-04-20 ENCOUNTER — Institutional Professional Consult (permissible substitution): Payer: 59 | Admitting: Neurology

## 2021-04-22 ENCOUNTER — Encounter: Payer: Self-pay | Admitting: Neurology

## 2021-11-27 ENCOUNTER — Emergency Department (HOSPITAL_COMMUNITY)
Admission: EM | Admit: 2021-11-27 | Discharge: 2021-11-27 | Disposition: A | Discharge status: Home / Self Care | Payer: 59 | Attending: Emergency Medicine | Admitting: Emergency Medicine

## 2021-11-27 ENCOUNTER — Encounter (HOSPITAL_COMMUNITY): Payer: Self-pay | Admitting: Emergency Medicine

## 2021-11-27 ENCOUNTER — Emergency Department (HOSPITAL_COMMUNITY): Payer: 59

## 2021-11-27 ENCOUNTER — Other Ambulatory Visit: Payer: Self-pay

## 2021-11-27 DIAGNOSIS — R079 Chest pain, unspecified: Secondary | ICD-10-CM | POA: Diagnosis not present

## 2021-11-27 DIAGNOSIS — R1012 Left upper quadrant pain: Secondary | ICD-10-CM | POA: Insufficient documentation

## 2021-11-27 LAB — BASIC METABOLIC PANEL
Anion gap: 6 (ref 5–15)
BUN: 13 mg/dL (ref 6–20)
CO2: 26 mmol/L (ref 22–32)
Calcium: 8.8 mg/dL — ABNORMAL LOW (ref 8.9–10.3)
Chloride: 107 mmol/L (ref 98–111)
Creatinine, Ser: 0.73 mg/dL (ref 0.61–1.24)
GFR, Estimated: >60 mL/min (ref >60)
Glucose, Bld: 123 mg/dL — ABNORMAL HIGH (ref 70–99)
Potassium: 4.1 mmol/L (ref 3.5–5.1)
Sodium: 139 mmol/L (ref 135–145)

## 2021-11-27 LAB — TROPONIN I (HIGH SENSITIVITY)
Troponin I (High Sensitivity): 3 ng/L (ref <18)
Troponin I (High Sensitivity): 3 ng/L (ref <18)

## 2021-11-27 LAB — CBC
HCT: 41 % (ref 39.0–52.0)
Hemoglobin: 13.3 g/dL (ref 13.0–17.0)
MCH: 29.2 pg (ref 26.0–34.0)
MCHC: 32.4 g/dL (ref 30.0–36.0)
MCV: 90.1 fL (ref 80.0–100.0)
Platelets: 223 10*3/uL (ref 150–400)
RBC: 4.55 MIL/uL (ref 4.22–5.81)
RDW: 12.8 % (ref 11.5–15.5)
WBC: 6.8 10*3/uL (ref 4.0–10.5)
nRBC: 0 % (ref 0.0–0.2)

## 2021-11-27 LAB — LIPASE, BLOOD: Lipase: 27 U/L (ref 11–51)

## 2021-11-27 MED ORDER — PANTOPRAZOLE SODIUM 40 MG PO TBEC: 40.0000 mg | DELAYED_RELEASE_TABLET | Freq: Every day | ORAL | 1 refills | Status: DC

## 2021-11-27 MED ORDER — FAMOTIDINE 20 MG PO TABS
20.0000 mg | ORAL_TABLET | Freq: Once | ORAL | Status: AC
  Administered 2021-11-27: 20 mg via ORAL
  Filled 2021-11-27: qty 1

## 2021-11-27 MED ORDER — PANTOPRAZOLE SODIUM 40 MG IV SOLR
40.0000 mg | Freq: Once | INTRAVENOUS | Status: AC
  Administered 2021-11-27: 40 mg via INTRAVENOUS
  Filled 2021-11-27: qty 10

## 2021-11-27 NOTE — Discharge Instructions (Signed)
Your testing was unremarkable, please start taking Protonix twice a day, additionally Pepcid twice a day  I have given you the phone number for the gastroenterologist, he should follow-up with them within the next week or 2, avoid alcohol, avoid fried or fatty foods  ER for severe worsening symptoms

## 2021-11-27 NOTE — ED Provider Notes (Signed)
[facility] EMERGENCY DEPARTMENT Provider Note   CSN: [id number] Arrival date & time: 11/27/21  1556     History  Chief Complaint  Patient presents with   Chest Pain    Arthur Collins is a 31 y.o. male.   Chest Pain  This patient is a 31 year old male, he has a history of a prior graft gastric sleeve procedure and acid reflux.  He has never had any coronary disease, he presents to the hospital today after having ongoing pain in the left upper quadrant and left chest for the last several days, seems to be waxing and waning in intensity, there is no associated nausea vomiting or diarrhea.  He has lost over 200 pounds since getting his surgery last year.  He initially went to the emergency department last night where he was seen, EKG was obtained and showed ST elevations.  Cardiology was consulted remotely from the outside hospital, old EKGs were compared and it was evident that he had chronic ST elevations which were not new.  His troponin was negative, delta troponin was negative and he was discharged home.  He comes back today as he feels like his abdominal discomfort was somewhat set to the back burner in place of looking at his cardiac history.  He states he still has the discomfort, same location, no swelling of the legs.  He has not been taking his omeprazole  Home Medications Prior to Admission medications   Medication Sig Start Date End Date Taking? Authorizing Provider  pantoprazole (PROTONIX) 40 MG tablet Take 1 tablet (40 mg total) by mouth daily. 11/27/21 12/27/21 Yes Noemi Chapel, MD  esomeprazole ([place]) 20 MG packet Take 20 mg by mouth daily before breakfast. 06/01/15 06/25/20  Jaynee Eagles, PA-C      Allergies    Patient has no known allergies.    Review of Systems   Review of Systems  Cardiovascular:  Positive for chest pain.  All other systems reviewed and are negative.  Physical Exam Updated Vital Signs BP 110/68    Pulse (!) 57    Temp 98.7 F  (37.1 C) (Oral)    Resp 18    SpO2 100%  Physical Exam Vitals and nursing note reviewed.  Constitutional:      General: He is not in acute distress.    Appearance: He is well-developed.  HENT:     Head: Normocephalic and atraumatic.     Mouth/Throat:     Pharynx: No oropharyngeal exudate.  Eyes:     General: No scleral icterus.       Right eye: No discharge.        Left eye: No discharge.     Conjunctiva/sclera: Conjunctivae normal.     Pupils: Pupils are equal, round, and reactive to light.  Neck:     Thyroid: No thyromegaly.     Vascular: No JVD.  Cardiovascular:     Rate and Rhythm: Normal rate and regular rhythm.     Heart sounds: Normal heart sounds. No murmur heard.   No friction rub. No gallop.  Pulmonary:     Effort: Pulmonary effort is normal. No respiratory distress.     Breath sounds: Normal breath sounds. No wheezing or rales.  Abdominal:     General: Bowel sounds are normal. There is no distension.     Palpations: Abdomen is soft. There is no mass.     Tenderness: There is abdominal tenderness.     Comments: On exam the patient  has minimal tenderness left upper quadrant but diffusely soft abdomen without any guarding, there is no pulsating mass  Musculoskeletal:        General: No tenderness. Normal range of motion.     Cervical back: Normal range of motion and neck supple.     Right lower leg: No tenderness. No edema.     Left lower leg: No tenderness. No edema.  Lymphadenopathy:     Cervical: No cervical adenopathy.  Skin:    General: Skin is warm and dry.     Findings: No erythema or rash.  Neurological:     Mental Status: He is alert.     Coordination: Coordination normal.  Psychiatric:        Behavior: Behavior normal.    ED Results / Procedures / Treatments   Labs (all labs ordered are listed, but only abnormal results are displayed) Labs Reviewed  BASIC METABOLIC PANEL - Abnormal; Notable for the following components:      Result Value    Glucose, Bld 123 (*)    Calcium 8.8 (*)    All other components within normal limits  CBC  LIPASE, BLOOD  TROPONIN I (HIGH SENSITIVITY)  TROPONIN I (HIGH SENSITIVITY)    EKG EKG Interpretation  Date/Time:  Saturday November 27 2021 16:02:29 EST Ventricular Rate:  74 PR Interval:  148 QRS Duration: 92 QT Interval:  366 QTC Calculation: 406 R Axis:   72 Text Interpretation: Normal sinus rhythm When compared with ECG of 11-Feb-2021 13:58, no change Confirmed by Dorie Rank [phone]) on 11/27/2021 4:09:20 PM  Radiology DG Chest 2 View  Result Date: 11/27/2021 CLINICAL DATA:  Chest pain EXAM: CHEST - 2 VIEW COMPARISON:  11/26/2021 FINDINGS: The heart size and mediastinal contours are within normal limits. Both lungs are clear. The visualized skeletal structures are unremarkable. IMPRESSION: No active cardiopulmonary disease. Electronically Signed   By: Davina Poke D.O.   On: 11/27/2021 16:45    Procedures Procedures    Medications Ordered in ED Medications  pantoprazole (PROTONIX) injection 40 mg (40 mg Intravenous Given 11/27/21 1745)  famotidine (PEPCID) tablet 20 mg (20 mg Oral Given 11/27/21 1745)    ED Course/ Medical Decision Making/ A&P                           Medical Decision Making Amount and/or Complexity of Data Reviewed Labs: ordered. Radiology: ordered.  Risk Prescription drug management.   This patient presents to the ED for concern of abdominal pain and left upper quadrant discomfort, this involves an extensive number of treatment options, and is a complaint that carries with it a high risk of complications and morbidity.  The differential diagnosis includes pancreatitis, peptic ulcer disease, splenomegaly though no spleen was palpated.  Could be intestinal but no changes in bowel habits, could be cardiac but has had extensive cardiac work-up over the last 24 hours and EKG is unchanged today   Co morbidities that complicate the patient  evaluation  Gastric surgery, gastric sleeve, history of acid reflux, could be peptic ulcer disease   Additional history obtained:  Additional history obtained from electronic medical record External records from outside source obtained and reviewed including laboratory work-up from yesterday showing normal troponins and EKGs which are unchanged over time including going back to 2016   Lab Tests:  I Ordered, and personally interpreted labs.  The pertinent results include: Troponin unremarkable, CBC and metabolic panel is unremarkable, lipase added  Imaging Studies ordered:  I ordered imaging studies including chest x-ray without any acute findings I independently visualized and interpreted imaging which showed no acute findings I agree with the radiologist interpretation   Cardiac Monitoring:  The patient was maintained on a cardiac monitor.  I personally viewed and interpreted the cardiac monitored which showed an underlying rhythm of: Sinus rhythm rate around 60 bpm in sinus rhythm   Medicines ordered and prescription drug management:  I ordered medication including proton pump inhibitor, and Pepcid: For acid reflux gastritis peptic ulcer disease abdominal pain Reevaluation of the patient after these medicines showed that the patient improved I have reviewed the patients home medicines and have made adjustments as needed   Test Considered:  CT scan of the abdomen and pelvis but this seems to be very low yield given the location of the patient's pain and the normal laboratory work-up   Critical Interventions:  Evaluation of the cause of the discomfort   Problem List / ED Course:  Patient had labs and chest x-ray, unremarkable work-up, negative troponin, negative lipase, stable for discharge   Reevaluation:  After the interventions noted above, I reevaluated the patient and found that they have : Improved   Social Determinants of  Health:  None   Dispostion:  After consideration of the diagnostic results and the patients response to treatment, I feel that the patent would benefit from discharge home.          Final Clinical Impression(s) / ED Diagnoses Final diagnoses:  LUQ pain    Rx / DC Orders ED Discharge Orders          Ordered    pantoprazole (PROTONIX) 40 MG tablet  Daily        11/27/21 1844              Noemi Chapel, MD 11/27/21 1846

## 2021-11-27 NOTE — ED Triage Notes (Signed)
C/o intermittent L sided chest and upper abd pain since Thursday.  States he was seen at [facility]) yesterday and given NTG.  States they had discussed transferring him to [facility] but discharged him home.  Reports SOB.  Denies nausea and vomiting.

## 2021-11-30 ENCOUNTER — Other Ambulatory Visit (HOSPITAL_COMMUNITY): Payer: Self-pay | Admitting: Family Medicine

## 2021-11-30 ENCOUNTER — Ambulatory Visit (HOSPITAL_COMMUNITY): Admission: RE | Admit: 2021-11-30 | Payer: 59 | Source: Ambulatory Visit

## 2021-11-30 ENCOUNTER — Other Ambulatory Visit: Payer: Self-pay | Admitting: Family Medicine

## 2021-11-30 DIAGNOSIS — Z9884 Bariatric surgery status: Secondary | ICD-10-CM

## 2021-11-30 DIAGNOSIS — R1012 Left upper quadrant pain: Secondary | ICD-10-CM

## 2021-12-01 ENCOUNTER — Other Ambulatory Visit: Payer: Self-pay

## 2021-12-01 ENCOUNTER — Ambulatory Visit (HOSPITAL_COMMUNITY)
Admission: RE | Admit: 2021-12-01 | Discharge: 2021-12-01 | Disposition: A | Discharge status: Home / Self Care | Payer: 59 | Source: Ambulatory Visit | Attending: Family Medicine | Admitting: Family Medicine

## 2021-12-01 DIAGNOSIS — Z9884 Bariatric surgery status: Secondary | ICD-10-CM | POA: Diagnosis present

## 2021-12-01 DIAGNOSIS — R1012 Left upper quadrant pain: Secondary | ICD-10-CM

## 2021-12-01 MED ORDER — IOHEXOL 300 MG/ML  SOLN
100.0000 mL | Freq: Once | INTRAMUSCULAR | Status: AC | PRN
  Administered 2021-12-01: 100 mL via INTRAVENOUS

## 2023-02-04 ENCOUNTER — Other Ambulatory Visit: Payer: Self-pay

## 2023-02-04 ENCOUNTER — Encounter: Payer: Self-pay | Admitting: Emergency Medicine

## 2023-02-04 ENCOUNTER — Ambulatory Visit
Admission: EM | Admit: 2023-02-04 | Discharge: 2023-02-04 | Disposition: A | Discharge status: Home / Self Care | Payer: 59 | Attending: Family Medicine | Admitting: Family Medicine

## 2023-02-04 DIAGNOSIS — Z113 Encounter for screening for infections with a predominantly sexual mode of transmission: Secondary | ICD-10-CM | POA: Diagnosis present

## 2023-02-04 DIAGNOSIS — Z202 Contact with and (suspected) exposure to infections with a predominantly sexual mode of transmission: Secondary | ICD-10-CM | POA: Diagnosis present

## 2023-02-04 MED ORDER — CEFTRIAXONE SODIUM 500 MG IJ SOLR
500.0000 mg | INTRAMUSCULAR | Status: DC
  Administered 2023-02-04: 500 mg via INTRAMUSCULAR

## 2023-02-04 MED ORDER — DOXYCYCLINE HYCLATE 100 MG PO CAPS: 100.0000 mg | ORAL_CAPSULE | Freq: Two times a day (BID) | ORAL | 0 refills | Status: DC

## 2023-02-04 NOTE — ED Triage Notes (Signed)
Pt reports spouse tested positive for gonorrhea and would like to be tested. Pt denies any symptoms or pain.

## 2023-02-04 NOTE — Discharge Instructions (Signed)

## 2023-02-04 NOTE — ED Provider Notes (Signed)
[facility]   [id number] 02/04/23 Arrival Time: 1144  ASSESSMENT & PLAN:  1. Exposure to gonorrhea   2. Screening for STDs (sexually transmitted diseases)    Meds ordered this encounter  Medications   cefTRIAXone (ROCEPHIN) injection 500 mg    Order Specific Question:   Antibiotic Indication:    Answer:   STD   doxycycline (VIBRAMYCIN) 100 MG capsule    Sig: Take 1 capsule (100 mg total) by mouth 2 (two) times daily.    Dispense:  14 capsule    Refill:  0      Discharge Instructions      You have been given the following today for treatment of suspected gonorrhea and/or chlamydia:  cefTRIAXone (ROCEPHIN) injection 500 mg  Please pick up your prescription for doxycycline 100 mg and begin taking twice daily for the next seven (7) days.  Even though we have treated you today, we have sent testing for sexually transmitted infections. We will notify you of any positive results once they are received. If required, we will prescribe any medications you might need.  Please refrain from all sexual activity for at least the next seven days.     Urethral and anal cytology pending.  Will notify of any positive results. Instructed to refrain from sexual activity for at least seven days.  Reviewed expectations re: course of current medical issues. Questions answered. Outlined signs and symptoms indicating need for more acute intervention. Patient verbalized understanding. After Visit Summary given.   SUBJECTIVE:  Arthur Collins is a 32 y.o. male who reports male spouse tested positive for gonorrhea. Would like testing and empiric treatment. Voices no symptoms. Receptive anal intercourse with current partner.  OBJECTIVE:  Vitals:   02/04/23 1237  BP: 126/86  Pulse: 63  Resp: 20  Temp: 98.1 F (36.7 C)  TempSrc: Oral  SpO2: 98%    General appearance: alert, cooperative, appears stated age and no distress Throat: lips, mucosa, and tongue normal; teeth and  gums normal Lungs: unlabored respirations; speaks full sentences without difficulty GU: deferred Skin: warm and dry Psychological: alert and cooperative; normal mood and affect.    No Known Allergies  Past Medical History:  Diagnosis Date   Frequent headaches    GERD (gastroesophageal reflux disease)    OSA on CPAP 03/11/2015   Dr Mellody Dance clance.    Vertigo    Family History  Problem Relation Age of Onset   Diabetes Mother    Hypertension Mother    Other Father        LIVER ISSUES   Epilepsy Brother    Healthy Maternal Grandmother    Healthy Brother    Healthy Brother    Healthy Brother    Healthy Brother    Social History   Socioeconomic History   Marital status: Married    Spouse name: Not on file   Number of children: Not on file   Years of education: Not on file   Highest education level: Not on file  Occupational History   Not on file  Tobacco Use   Smoking status: Every Day    Packs/day: .25    Types: Cigarettes   Smokeless tobacco: Never  Vaping Use   Vaping Use: Every day   Substances: Nicotine, Flavoring  Substance and Sexual Activity   Alcohol use: Yes    Alcohol/week: 0.0 standard drinks of alcohol    Comment: rarely   Drug use: No   Sexual activity: Not on file  Other Topics Concern   Not on file  Social History Narrative   Drinks 2-3 cups of caffeine.   Social Determinants of Health   Financial Resource Strain: Not on file  Food Insecurity: Not on file  Transportation Needs: Not on file  Physical Activity: Not on file  Stress: Not on file  Social Connections: Not on file  Intimate Partner Violence: Not on file           Mardella Layman, MD 02/04/23 1332

## 2023-02-06 LAB — CYTOLOGY, (ORAL, ANAL, URETHRAL) ANCILLARY ONLY
Chlamydia: NEGATIVE
Chlamydia: NEGATIVE
Comment: NEGATIVE
Comment: NEGATIVE
Comment: NEGATIVE
Comment: NEGATIVE
Comment: NORMAL
Comment: NORMAL
Neisseria Gonorrhea: NEGATIVE
Neisseria Gonorrhea: POSITIVE — AB
Trichomonas: NEGATIVE
Trichomonas: NEGATIVE

## 2023-03-22 ENCOUNTER — Other Ambulatory Visit (HOSPITAL_COMMUNITY): Payer: Self-pay | Admitting: Family Medicine

## 2023-03-22 DIAGNOSIS — R221 Localized swelling, mass and lump, neck: Secondary | ICD-10-CM

## 2023-04-11 ENCOUNTER — Ambulatory Visit (HOSPITAL_COMMUNITY)
Admission: RE | Admit: 2023-04-11 | Discharge: 2023-04-11 | Disposition: A | Discharge status: Home / Self Care | Payer: 59 | Source: Ambulatory Visit | Attending: Family Medicine | Admitting: Family Medicine

## 2023-04-11 DIAGNOSIS — R221 Localized swelling, mass and lump, neck: Secondary | ICD-10-CM | POA: Diagnosis present

## 2023-05-24 ENCOUNTER — Emergency Department (HOSPITAL_COMMUNITY)
Admission: EM | Admit: 2023-05-24 | Discharge: 2023-05-24 | Disposition: A | Discharge status: Home / Self Care | Payer: 59 | Attending: Student | Admitting: Student

## 2023-05-24 ENCOUNTER — Encounter (HOSPITAL_COMMUNITY): Payer: Self-pay | Admitting: *Deleted

## 2023-05-24 ENCOUNTER — Other Ambulatory Visit (HOSPITAL_COMMUNITY): Payer: Self-pay

## 2023-05-24 ENCOUNTER — Other Ambulatory Visit: Payer: Self-pay

## 2023-05-24 ENCOUNTER — Emergency Department (HOSPITAL_COMMUNITY): Payer: 59

## 2023-05-24 DIAGNOSIS — Z7901 Long term (current) use of anticoagulants: Secondary | ICD-10-CM | POA: Insufficient documentation

## 2023-05-24 DIAGNOSIS — I4891 Unspecified atrial fibrillation: Secondary | ICD-10-CM | POA: Diagnosis not present

## 2023-05-24 DIAGNOSIS — R002 Palpitations: Secondary | ICD-10-CM | POA: Diagnosis present

## 2023-05-24 LAB — BASIC METABOLIC PANEL
Anion gap: 6 (ref 5–15)
BUN: 12 mg/dL (ref 6–20)
CO2: 27 mmol/L (ref 22–32)
Calcium: 9.3 mg/dL (ref 8.9–10.3)
Chloride: 105 mmol/L (ref 98–111)
Creatinine, Ser: 0.65 mg/dL (ref 0.61–1.24)
GFR, Estimated: >60 mL/min (ref >60)
Glucose, Bld: 93 mg/dL (ref 70–99)
Potassium: 4.1 mmol/L (ref 3.5–5.1)
Sodium: 138 mmol/L (ref 135–145)

## 2023-05-24 LAB — CBC
HCT: 45.2 % (ref 39.0–52.0)
Hemoglobin: 14.8 g/dL (ref 13.0–17.0)
MCH: 29 pg (ref 26.0–34.0)
MCHC: 32.7 g/dL (ref 30.0–36.0)
MCV: 88.6 fL (ref 80.0–100.0)
Platelets: 209 10*3/uL (ref 150–400)
RBC: 5.1 MIL/uL (ref 4.22–5.81)
RDW: 12.1 % (ref 11.5–15.5)
WBC: 5.6 10*3/uL (ref 4.0–10.5)
nRBC: 0 % (ref 0.0–0.2)

## 2023-05-24 LAB — TSH: TSH: 1.421 u[IU]/mL (ref 0.350–4.500)

## 2023-05-24 LAB — MAGNESIUM: Magnesium: 2.3 mg/dL (ref 1.7–2.4)

## 2023-05-24 LAB — D-DIMER, QUANTITATIVE: D-Dimer, Quant: <0.27 ug/mL-FEU (ref 0.00–0.50)

## 2023-05-24 MED ORDER — APIXABAN 5 MG PO TABS
5.0000 mg | ORAL_TABLET | Freq: Two times a day (BID) | ORAL | Status: AC
  Administered 2023-05-24: 5 mg via ORAL
  Filled 2023-05-24: qty 1

## 2023-05-24 MED ORDER — DILTIAZEM LOAD VIA INFUSION
10.0000 mg | Freq: Once | INTRAVENOUS | Status: AC
  Administered 2023-05-24: 10 mg via INTRAVENOUS
  Filled 2023-05-24: qty 10

## 2023-05-24 MED ORDER — APIXABAN 5 MG PO TABS: 5.0000 mg | ORAL_TABLET | Freq: Two times a day (BID) | ORAL | 0 refills | Status: DC

## 2023-05-24 MED ORDER — PROPOFOL 10 MG/ML IV BOLUS
1.0000 mg/kg | Freq: Once | INTRAVENOUS | Status: DC
  Filled 2023-05-24: qty 20

## 2023-05-24 MED ORDER — SODIUM CHLORIDE 0.9 % IV SOLN
INTRAVENOUS | Status: AC | PRN
  Administered 2023-05-24: 1000 mL via INTRAVENOUS

## 2023-05-24 MED ORDER — DILTIAZEM HCL-DEXTROSE 125-5 MG/125ML-% IV SOLN (PREMIX)
5.0000 mg/h | INTRAVENOUS | Status: DC
  Administered 2023-05-24: 5 mg/h via INTRAVENOUS
  Filled 2023-05-24: qty 125

## 2023-05-24 MED ORDER — PROPOFOL 10 MG/ML IV BOLUS
INTRAVENOUS | Status: AC | PRN
  Administered 2023-05-24: 120 mg via INTRAVENOUS

## 2023-05-24 NOTE — ED Provider Notes (Addendum)
.Cardioversion  Date/Time: 05/24/2023 9:47 PM  Performed by: Glendora Score, MD Authorized by: Glendora Score, MD   Consent:    Consent obtained:  Verbal   Consent given by:  Patient   Risks discussed:  Cutaneous burn, death, pain and induced arrhythmia   Alternatives discussed:  Rate-control medication Pre-procedure details:    Cardioversion basis:  Elective   Rhythm:  Atrial flutter   Electrode placement:  Anterior-posterior Patient sedated: Yes. Refer to sedation procedure documentation for details of sedation.  Attempt one:    Cardioversion mode:  Synchronous   Shock (Joules):  200   Shock outcome:  Conversion to normal sinus rhythm Post-procedure details:    Patient status:  Awake   Patient tolerance of procedure:  Tolerated well, no immediate complications .Critical Care  Performed by: Glendora Score, MD Authorized by: Glendora Score, MD   Critical care provider statement:    Critical care time (minutes):  30   Critical care was necessary to treat or prevent imminent or life-threatening deterioration of the following conditions:  Cardiac failure   Critical care was time spent personally by me on the following activities:  Development of treatment plan with patient or surrogate, discussions with consultants, evaluation of patient's response to treatment, examination of patient, ordering and review of laboratory studies, ordering and review of radiographic studies, ordering and performing treatments and interventions, pulse oximetry, re-evaluation of patient's condition and review of old charts .Sedation  Date/Time: 05/24/2023 9:47 PM  Performed by: Glendora Score, MD Authorized by: Glendora Score, MD   Consent:    Consent obtained:  Verbal   Consent given by:  Patient   Risks discussed:  Allergic reaction, dysrhythmia, inadequate sedation, nausea, prolonged hypoxia resulting in organ damage, prolonged sedation necessitating reversal, respiratory compromise  necessitating ventilatory assistance and intubation and vomiting   Alternatives discussed:  Analgesia without sedation, anxiolysis and regional anesthesia Universal protocol:    Procedure explained and questions answered to patient or proxy's satisfaction: yes     Relevant documents present and verified: yes     Test results available: yes     Imaging studies available: yes     Required blood products, implants, devices, and special equipment available: yes     Site/side marked: yes     Immediately prior to procedure, a time out was called: yes     Patient identity confirmed:  Verbally with patient Indications:    Procedure necessitating sedation performed by:  Physician performing sedation Pre-sedation assessment:    Time since last food or drink:  0800   ASA classification: class 1 - normal, healthy patient     Mouth opening:  3 or more finger widths   Thyromental distance:  4 finger widths   Mallampati score:  I - soft palate, uvula, fauces, pillars visible   Neck mobility: normal     Pre-sedation assessments completed and reviewed: airway patency, cardiovascular function, hydration status, mental status, nausea/vomiting, pain level, respiratory function and temperature   Immediate pre-procedure details:    Reassessment: Patient reassessed immediately prior to procedure     Reviewed: vital signs, relevant labs/tests and NPO status     Verified: bag valve mask available, emergency equipment available, intubation equipment available, IV patency confirmed, oxygen available and suction available   Procedure details (see MAR for exact dosages):    Preoxygenation:  Nasal cannula   Sedation:  Propofol   Intended level of sedation: deep   Intra-procedure monitoring:  Blood pressure monitoring, cardiac monitor, continuous pulse oximetry,  frequent LOC assessments, frequent vital sign checks and continuous capnometry   Intra-procedure events: none     Total Provider sedation time (minutes):   15 Post-procedure details:    Attendance: Constant attendance by certified staff until patient recovered     Recovery: Patient returned to pre-procedure baseline     Post-sedation assessments completed and reviewed: airway patency, cardiovascular function, hydration status, mental status, nausea/vomiting, pain level, respiratory function and temperature     Patient is stable for discharge or admission: yes     Procedure completion:  Tolerated well, no immediate complications     Glendora Score, MD 05/24/23 2147    Glendora Score, MD 05/24/23 2147

## 2023-05-24 NOTE — Discharge Instructions (Signed)
It was a pleasure taking care of you today.  You were in an irregular rhythm called atrial fibrillation.  Your blood work was all reassuring.  We gave you some medication to help slow your heart rate down which did not fix the problem.  To be consulted with the cardiologist who recommended a shock to reset the rhythm.  This worked and he has been in a normal rhythm.  We need to start you on a blood thinner called Eliquis to take twice a day for the next 4 weeks to prevent risk of stroke.  As we discussed this can increase risk of bleeding care.  If you fall or injure yourself you should be evaluated.  It may take longer than usual to stop bleeding if you cut yourself.  Please follow-up closely with the heart doctor and your primary care doctor, come back to the ER for any worsening symptoms.

## 2023-05-24 NOTE — ED Provider Notes (Signed)
[facility] AT [facility] Provider Note   CSN: [id number] Arrival date & time: 05/24/23  1210     History  Chief Complaint  Patient presents with   Chest Pain    Arthur Collins is a 32 y.o. male.  He has past history of sleeve gastrectomy 3 years ago in [place], the ER today complaining of palpitations, fast heart rate.  This around 9 AM this morning.  He was set up on his Apple watch and states it said he was in atrial fibrillation around 11 AM.  States he had palpitations before but they never lasted long.  These do feel similar but he is never been diagnosed with atrial fibrillation.  He has history of morbid obesity but lost 200 pounds with his sleeve gastrectomy.  He was seen by cardiology February 2023 and had reassuring workup.   Chest Pain      Home Medications Prior to Admission medications   Medication Sig Start Date End Date Taking? Authorizing Provider  apixaban (ELIQUIS) 5 MG TABS tablet Take 1 tablet (5 mg total) by mouth 2 (two) times daily for 28 days. 05/24/23 06/21/23 Yes Barnet Benavides A, PA-C  doxycycline (VIBRAMYCIN) 100 MG capsule Take 1 capsule (100 mg total) by mouth 2 (two) times daily. 02/04/23   Mardella Layman, MD  esomeprazole (NEXIUM) 20 MG packet Take 20 mg by mouth daily before breakfast. 06/01/15 06/25/20  Wallis Bamberg, PA-C      Allergies    Patient has no known allergies.    Review of Systems   Review of Systems  Cardiovascular:  Positive for chest pain.    Physical Exam Updated Vital Signs BP 99/70 (BP Location: Right Arm)   Pulse 66   Temp 97.7 F (36.5 C) (Oral)   Resp 11   Ht 5\' 9"  (1.753 m)   Wt 83.9 kg   SpO2 98%   BMI 27.32 kg/m  Physical Exam Vitals and nursing note reviewed.  Constitutional:      General: He is not in acute distress.    Appearance: He is well-developed.  HENT:     Head: Normocephalic and atraumatic.  Eyes:     Conjunctiva/sclera: Conjunctivae normal.  Neck:     Vascular: No  JVD.  Cardiovascular:     Rate and Rhythm: Tachycardia present. Rhythm irregular.     Heart sounds: No murmur heard. Pulmonary:     Effort: Pulmonary effort is normal. No respiratory distress.     Breath sounds: Normal breath sounds.  Abdominal:     Palpations: Abdomen is soft.     Tenderness: There is no abdominal tenderness.  Musculoskeletal:        General: No swelling.     Cervical back: Normal range of motion and neck supple.  Skin:    General: Skin is warm and dry.     Capillary Refill: Capillary refill takes less than 2 seconds.  Neurological:     General: No focal deficit present.     Mental Status: He is alert and oriented to person, place, and time.  Psychiatric:        Mood and Affect: Mood normal.     ED Results / Procedures / Treatments   Labs (all labs ordered are listed, but only abnormal results are displayed) Labs Reviewed  BASIC METABOLIC PANEL  CBC  TSH  D-DIMER, QUANTITATIVE  MAGNESIUM    EKG None  Radiology DG Chest Port 1 View  Result Date: 05/24/2023 CLINICAL  DATA:  Atrial fibrillation.  Chest pain. EXAM: PORTABLE CHEST 1 VIEW COMPARISON:  11/27/2021 FINDINGS: The cardiomediastinal contours are normal. Mild bronchial thickening. Pulmonary vasculature is normal. No consolidation, pleural effusion, or pneumothorax. No acute osseous abnormalities are seen. IMPRESSION: Mild bronchial thickening. Electronically Signed   By: Narda Rutherford M.D.   On: 05/24/2023 13:25    Procedures Procedures    Medications Ordered in ED Medications  diltiazem (CARDIZEM) 1 mg/mL load via infusion 10 mg (10 mg Intravenous Bolus from Bag 05/24/23 1440)    And  diltiazem (CARDIZEM) 125 mg in dextrose 5% 125 mL (1 mg/mL) infusion (0 mg/hr Intravenous Stopped 05/24/23 1512)  propofol (DIPRIVAN) 10 mg/mL bolus/IV push 83.9 mg (200 mg Intravenous See Procedure Record 05/24/23 1516)  0.9 %  sodium chloride infusion (0 mLs Intravenous Stopped 05/24/23 1520)  propofol (DIPRIVAN)  10 mg/mL bolus/IV push (120 mg Intravenous Given 05/24/23 1516)  apixaban (ELIQUIS) tablet 5 mg (5 mg Oral Given 05/24/23 1616)    ED Course/ Medical Decision Making/ A&P Clinical Course as of 05/24/23 1626  Wed May 24, 2023  1500 Started with palpitations at 9 AM this morning, CHA2DS2-VASc is 0.  D-dimer rules out PE, not having hypoxia or chest pain.  He was tacky at 120s in A-fib with RVR on EKG upon arrival, he is hemodynamically stable.  Given diltiazem for rate control, consulted Dr. Ancil Boozer who recommended cardioversion in 4 weeks of anticoagulation with outpatient follow-up with cardiology.  Discussed in detail risks and benefits of both procedural sedation with patient and his husband as well as cardioversion.  Discussed risk of oversedation, needing intubation or oxygenation, risk of hypotension needing intervention.  Discussed risk of failed cardioversion or needed a second attempt.  Discussed need of possible ACS,.  Discussed options that will be taken including nasal cannula oxygen and end-tidal CO2 monitoring.  Has been understanding risks and are agreeable with procedures [CB]    Clinical Course User Index [CB] Ma Rings, PA-C                                 Medical Decision Making This patient presents to the ED for concern of palpitations, this involves an extensive number of treatment options, and is a complaint that carries with it a high risk of complications and morbidity.  The differential diagnosis includes afib, pacs, pvc, PE, sinus tachycardia, v tach, other   Co morbidities that complicate the patient evaluation :   prior sleeve gastrectomy   Additional history obtained:  Additional history obtained from EMR External records from outside source obtained and reviewed including notes   Lab Tests:  I Ordered, and personally interpreted labs.  The pertinent results include: BC, BMP, magnesium and D-dimer all negative, TSH is normal   Imaging Studies  ordered:  I ordered imaging studies including x-ray I independently visualized and interpreted imaging which showed mild bronchial thickening, no opacity or pulmonary edema but no cardiomegaly I agree with the radiologist interpretation   Cardiac Monitoring: / EKG:  The patient was maintained on a cardiac monitor.  I personally viewed and interpreted the cardiac monitored which showed an underlying rhythm of: A-fib with rapid ventricular response   Consultations Obtained:  I requested consultation with the cardiologist Dr. Ancil Boozer,  and discussed lab and imaging findings as well as pertinent plan - they recommend: Cardioversion and discharged on 4 weeks of anticoagulation, follow-up with cardiology outpatient  Problem List / ED Course / Critical interventions / Medication management  Atrial fibrillation with rapid ventricular response-patient started having palpitations at 9 AM this morning suddenly.  Has had them remotely the past but nothing recently, but his Apple Watch on and it alerted him that he was in atrial fibrillation.  He has tried to relax and his heart rate remained elevated so he come to the ER.  No chest pain or shortness of breath, no nausea or vomiting, just days the fast area heart rate feels uncomfortable to him.  No dizziness or syncope.  Brought to the room, given diltiazem with no significant change in heart rate, discussed with cardiology as above recommend cardioversion due to sudden onset with symptoms this morning and very low risk for stroke.  Patient was consented and had successful cardioversion by Dr. Posey Rea I ordered medication including diltiazem for A-fib with RVR Reevaluation of the patient after these medicines showed that the patient stayed the same I have reviewed the patients home medicines and have made adjustments as needed       Amount and/or Complexity of Data Reviewed Labs: ordered. Radiology: ordered.  Risk Prescription drug  management.           Final Clinical Impression(s) / ED Diagnoses Final diagnoses:  Atrial fibrillation status post cardioversion [facility])    Rx / DC Orders ED Discharge Orders          Ordered    apixaban (ELIQUIS) 5 MG TABS tablet  2 times daily        05/24/23 1555    Ambulatory referral to Cardiology       Comments: If you have not heard from the Cardiology office within the next 72 hours please call [phone].   05/24/23 1623              Ma Rings, PA-C 05/24/23 1626    Glendora Score, MD 05/24/23 2146

## 2023-05-24 NOTE — ED Triage Notes (Signed)
Pt presents with left-side CP that started this morning, had similar episode last and diagnosed A-fib.

## 2023-05-29 ENCOUNTER — Encounter: Payer: Self-pay | Admitting: Internal Medicine

## 2023-05-29 ENCOUNTER — Ambulatory Visit: Payer: 59 | Attending: Internal Medicine | Admitting: Internal Medicine

## 2023-05-29 ENCOUNTER — Telehealth: Payer: Self-pay | Admitting: Internal Medicine

## 2023-05-29 VITALS — BP 108/60 | HR 56 | Ht 69.0 in | Wt 188.4 lb

## 2023-05-29 DIAGNOSIS — T300 Burn of unspecified body region, unspecified degree: Secondary | ICD-10-CM | POA: Diagnosis not present

## 2023-05-29 DIAGNOSIS — I480 Paroxysmal atrial fibrillation: Secondary | ICD-10-CM | POA: Diagnosis not present

## 2023-05-29 DIAGNOSIS — I4891 Unspecified atrial fibrillation: Secondary | ICD-10-CM | POA: Diagnosis not present

## 2023-05-29 NOTE — Patient Instructions (Addendum)
Medication Instructions:  Your physician has recommended you make the following change in your medication:  Take eliquis for a total of 4 weeks, then stop it  Labwork: none  Testing/Procedures: Your physician has requested that you have an echocardiogram. Echocardiography is a painless test that uses sound waves to create images of your heart. It provides your doctor with information about the size and shape of your heart and how well your heart's chambers and valves are working. This procedure takes approximately one hour. There are no restrictions for this procedure. Please do NOT wear cologne, perfume, aftershave, or lotions (deodorant is allowed). Please arrive 15 minutes prior to your appointment time. Your physician has recommended that you wear an event monitor for 30 days. Event monitors are medical devices that record the heart's electrical activity. Doctors most often [place] these monitors to diagnose arrhythmias. Arrhythmias are problems with the speed or rhythm of the heartbeat. The monitor is a small, portable device. You can wear one while you do your normal daily activities. This is usually used to diagnose what is causing palpitations/syncope (passing out). ([employer] or [employer]) will mail this monitor to your home address.  Follow-Up: Your physician recommends that you schedule a follow-up appointment in: pending  Any Other Special Instructions Will Be Listed Below (If Applicable).  If you need a refill on your cardiac medications before your next appointment, please call your pharmacy.

## 2023-05-29 NOTE — Progress Notes (Signed)
Cardiology Office Note  Date: 05/29/2023   ID: Arthur Collins, DOB 09/02/1991, MRN [id number]  PCP:  Assunta Found, MD  Cardiologist:  Marjo Bicker, MD Electrophysiologist:  None   History of Present Illness: Arthur Collins is a 32 y.o. male known to have paroxysmal A-fib is here as new patient visit.  Presented to ER on 05/24/2023 with A-fib with RVR/palpitations and underwent successful DCCV with conversion to NSR.  After DCCV, he had erythema and burning at the position of the electrode.  He has pain around the site and also in his back.  Otherwise, no angina, DOE, syncope.  Has some dizziness when he bends down.  Has palpitations few times per week and last for a few seconds.  Past Medical History:  Diagnosis Date   Frequent headaches    GERD (gastroesophageal reflux disease)    OSA on CPAP 03/11/2015   Dr Mellody Dance clance.    Vertigo     Past Surgical History:  Procedure Laterality Date   FRACTURE SURGERY     LAPAROSCOPIC PARTIAL GASTRECTOMY  06/2020   Sleeve gastrectomy, [place]   ORIF HUMERUS FRACTURE      Current Outpatient Medications  Medication Sig Dispense Refill   apixaban (ELIQUIS) 5 MG TABS tablet Take 1 tablet (5 mg total) by mouth 2 (two) times daily for 28 days. 56 tablet 0   No current facility-administered medications for this visit.   Allergies:  Patient has no known allergies.   Social History: The patient  reports that he has been smoking cigarettes. He has never used smokeless tobacco. He reports current alcohol use. He reports that he does not use drugs.   Family History: The patient's family history includes Diabetes in his mother; Epilepsy in his brother; Healthy in his brother, brother, brother, brother, and maternal grandmother; Hypertension in his mother; Other in his father.   ROS:  Please see the history of present illness. Otherwise, complete review of systems is positive for none  All other systems are reviewed and negative.    Physical Exam: VS:  BP 108/60 (BP Location: Left Arm, Patient Position: Sitting, Cuff Size: Normal)   Pulse (!) 56   Ht 5\' 9"  (1.753 m)   Wt 188 lb 6.4 oz (85.5 kg)   SpO2 98%   BMI 27.82 kg/m , BMI Body mass index is 27.82 kg/m.  Wt Readings from Last 3 Encounters:  05/29/23 188 lb 6.4 oz (85.5 kg)  05/24/23 185 lb (83.9 kg)  10/01/20 239 lb (108.4 kg)    General: Patient appears comfortable at rest. HEENT: Conjunctiva and lids normal, oropharynx clear with moist mucosa. Neck: Supple, no elevated JVP or carotid bruits, no thyromegaly. Lungs: Clear to auscultation, nonlabored breathing at rest. Cardiac: Regular rate and rhythm, no S3 or significant systolic murmur, no pericardial rub. Abdomen: Soft, nontender, no hepatomegaly, bowel sounds present, no guarding or rebound. Extremities: No pitting edema, distal pulses 2+. Skin: Warm and dry. Musculoskeletal: No kyphosis. Neuropsychiatric: Alert and oriented x3, affect grossly appropriate.  Recent Labwork: 05/24/2023: BUN 12; Creatinine, Ser 0.65; Hemoglobin 14.8; Magnesium 2.3; Platelets 209; Potassium 4.1; Sodium 138; TSH 1.421     Component Value Date/Time   CHOL 155 06/01/2015 0936   TRIG 99 06/01/2015 0936   HDL 39 (L) 06/01/2015 0936   CHOLHDL 4.0 06/01/2015 0936   VLDL 20 06/01/2015 0936   LDLCALC 96 06/01/2015 0936     Assessment and Plan:  Paroxysmal A-fib s/p DCCV in 8/24: Presented  to ER on 05/24/2023 with palpitations, EKG showed A-fib with RVR, underwent DCCV x 1 with 200 J resulting in successful conversion to NSR. Continues to be in NSR on today's EKG.  After DCCV, he has occasional palpitations lasting for a few seconds and occurring few times per week.  Will obtain 30-day event monitor for A-fib surveillance.  If he has significant A-fib burden, will need to continue Eliquis 5 mg twice daily indefinitely.  Otherwise, continue Eliquis 5 mg twice daily for total duration of 4 weeks and stop.  He has a diagnosis of  OSA on the chart but patient reports he never was diagnosed before and never tried CPAP.  Follow with PCP, will need a repeat sleep study to definitely rule out sleep apnea as a last significant amount of weight (was 200 pounds prior to gastric bypass surgery). Obtain 2D echocardiogram.  Skin burns after DCCV: Erythema resolved, can apply moisturizing cream.  Noncardiac chest pain: Has some residual chest soreness from DCCV due to skin burn.  Erythema resolved.       Medication Adjustments/Labs and Tests Ordered: Current medicines are reviewed at length with the patient today.  Concerns regarding medicines are outlined above.    Disposition:  Follow up pending results  Signed Telesha Deguzman Verne Spurr, MD, 05/29/2023 3:16 PM    [facility] [address] [place], [place], [place] [address]

## 2023-05-29 NOTE — Telephone Encounter (Signed)
Checking percert on the following patient   2 D Echo dx: A-fib 30 Day Event Monitor dx: A-fib

## 2023-06-07 ENCOUNTER — Ambulatory Visit: Payer: 59

## 2023-06-07 ENCOUNTER — Ambulatory Visit: Payer: 59 | Attending: Internal Medicine

## 2023-06-07 DIAGNOSIS — I480 Paroxysmal atrial fibrillation: Secondary | ICD-10-CM

## 2023-06-15 ENCOUNTER — Ambulatory Visit: Payer: 59 | Attending: Internal Medicine

## 2023-06-15 DIAGNOSIS — I480 Paroxysmal atrial fibrillation: Secondary | ICD-10-CM | POA: Diagnosis not present

## 2023-06-15 LAB — ECHOCARDIOGRAM COMPLETE
AR max vel: 2.33 cm2
AV Area VTI: 2.3 cm2
AV Area mean vel: 2.38 cm2
AV Mean grad: 6 mmHg
AV Peak grad: 12.1 mmHg
Ao pk vel: 1.74 m/s
Calc EF: 65 %
S' Lateral: 2.9 cm
Single Plane A2C EF: 69.4 %
Single Plane A4C EF: 66.2 %

## 2023-06-30 ENCOUNTER — Telehealth: Payer: Self-pay

## 2023-06-30 NOTE — Telephone Encounter (Signed)
Left message for patient to call the office

## 2023-08-10 ENCOUNTER — Other Ambulatory Visit: Payer: Self-pay | Admitting: Internal Medicine

## 2023-08-10 DIAGNOSIS — I4891 Unspecified atrial fibrillation: Secondary | ICD-10-CM

## 2023-08-10 DIAGNOSIS — I480 Paroxysmal atrial fibrillation: Secondary | ICD-10-CM

## 2024-05-19 ENCOUNTER — Emergency Department (HOSPITAL_COMMUNITY)

## 2024-05-19 ENCOUNTER — Encounter (HOSPITAL_COMMUNITY): Payer: Self-pay

## 2024-05-19 ENCOUNTER — Emergency Department (HOSPITAL_COMMUNITY)
Admission: EM | Admit: 2024-05-19 | Discharge: 2024-05-19 | Disposition: A | Discharge status: Home / Self Care | Attending: Emergency Medicine | Admitting: Emergency Medicine

## 2024-05-19 DIAGNOSIS — I4891 Unspecified atrial fibrillation: Secondary | ICD-10-CM | POA: Diagnosis present

## 2024-05-19 DIAGNOSIS — Z7901 Long term (current) use of anticoagulants: Secondary | ICD-10-CM | POA: Insufficient documentation

## 2024-05-19 LAB — CBC
HCT: 43.4 % (ref 39.0–52.0)
Hemoglobin: 14.8 g/dL (ref 13.0–17.0)
MCH: 29.8 pg (ref 26.0–34.0)
MCHC: 34.1 g/dL (ref 30.0–36.0)
MCV: 87.3 fL (ref 80.0–100.0)
Platelets: 232 K/uL (ref 150–400)
RBC: 4.97 MIL/uL (ref 4.22–5.81)
RDW: 12.1 % (ref 11.5–15.5)
WBC: 8.3 K/uL (ref 4.0–10.5)
nRBC: 0 % (ref 0.0–0.2)

## 2024-05-19 LAB — BASIC METABOLIC PANEL WITH GFR
Anion gap: 11 (ref 5–15)
BUN: 9 mg/dL (ref 6–20)
CO2: 22 mmol/L (ref 22–32)
Calcium: 9.6 mg/dL (ref 8.9–10.3)
Chloride: 104 mmol/L (ref 98–111)
Creatinine, Ser: 0.67 mg/dL (ref 0.61–1.24)
GFR, Estimated: >60 mL/min (ref >60)
Glucose, Bld: 117 mg/dL — ABNORMAL HIGH (ref 70–99)
Potassium: 3.8 mmol/L (ref 3.5–5.1)
Sodium: 137 mmol/L (ref 135–145)

## 2024-05-19 LAB — MAGNESIUM: Magnesium: 2.4 mg/dL (ref 1.7–2.4)

## 2024-05-19 MED ORDER — DILTIAZEM LOAD VIA INFUSION
15.0000 mg | Freq: Once | INTRAVENOUS | Status: AC
  Administered 2024-05-19: 15 mg via INTRAVENOUS
  Filled 2024-05-19: qty 15

## 2024-05-19 MED ORDER — ETOMIDATE 2 MG/ML IV SOLN
INTRAVENOUS | Status: AC | PRN
  Administered 2024-05-19: 11.99 mg via INTRAVENOUS

## 2024-05-19 MED ORDER — DILTIAZEM HCL-DEXTROSE 125-5 MG/125ML-% IV SOLN (PREMIX)
5.0000 mg/h | INTRAVENOUS | Status: DC
  Administered 2024-05-19: 5 mg/h via INTRAVENOUS
  Filled 2024-05-19: qty 125

## 2024-05-19 MED ORDER — APIXABAN 5 MG PO TABS
5.0000 mg | ORAL_TABLET | Freq: Two times a day (BID) | ORAL | Status: DC
  Administered 2024-05-19: 5 mg via ORAL
  Filled 2024-05-19 (×2): qty 1

## 2024-05-19 MED ORDER — APIXABAN (ELIQUIS) EDUCATION KIT FOR DVT/PE PATIENTS: PACK | Freq: Once | Status: DC

## 2024-05-19 MED ORDER — APIXABAN 5 MG PO TABS: 5.0000 mg | ORAL_TABLET | Freq: Two times a day (BID) | ORAL | 0 refills | Status: DC

## 2024-05-19 MED ORDER — ETOMIDATE 2 MG/ML IV SOLN
10.0000 mg | Freq: Once | INTRAVENOUS | Status: DC
  Filled 2024-05-19: qty 10

## 2024-05-19 NOTE — ED Triage Notes (Signed)
 Pt comes in after blacking out for a few seconds around 0400 this morning. Ever since the pt has been experiencing a flutter in my chest. Pt's apple watch has been reading HR of 140-160 and has not gone down. Pt believes he was dx with a-fib last year because they had to shock me to fix my rhythm. A&Ox4.

## 2024-05-19 NOTE — Discharge Instructions (Addendum)
 You were seen today and found to be in atrial fibrillation.  You were cardioverted.  Restart Eliquis  and follow back up with cardiology.  If you develop chest pain, palpitations, any new or worsening symptoms in the interim you should be reevaluated.

## 2024-05-19 NOTE — ED Provider Notes (Signed)
 [place] EMERGENCY DEPARTMENT AT [facility] Provider Note   CSN: [id number] Arrival date & time: 05/19/24  0544     Patient presents with: Atrial Fibrillation   Arthur Collins is a 33 y.o. male.   HPI     This is a 33 year old male who presents with palpitations and is concerned he is in atrial fibrillation.  Reports a history of atrial fibrillation diagnosed 1 year ago.  No longer on any medications including blood thinners.  States that he felt disoriented this morning and has had a fluttering in his chest.  He states that he feels like his opinion 1 going back and forth.  Apple watch noted heart rates to 140s to 160s.  Previously reports needing cardioversion.  Denies chest pain or shortness of breath.  No recent changes in medication.  Denies alcohol or drug use.  Drinks tea daily.  Prior to Admission medications   Medication Sig Start Date End Date Taking? Authorizing Provider  apixaban  (ELIQUIS ) 5 MG TABS tablet Take 1 tablet (5 mg total) by mouth 2 (two) times daily. 05/19/24 06/18/24 Yes Shade Kaley, Charmaine FALCON, MD  esomeprazole  (NEXIUM ) 20 MG packet Take 20 mg by mouth daily before breakfast. 06/01/15 06/25/20  Christopher Savannah, PA-C    Allergies: Patient has no known allergies.    Review of Systems  Constitutional:  Negative for fever.  Respiratory:  Negative for shortness of breath.   Cardiovascular:  Positive for palpitations. Negative for chest pain.  Gastrointestinal:  Negative for abdominal pain.  All other systems reviewed and are negative.   Updated Vital Signs BP 114/68   Pulse 73   Temp 97.8 F (36.6 C) (Oral)   Resp 12   Ht 1.753 m (5' 9)   Wt 80.7 kg   SpO2 100%   BMI 26.29 kg/m   Physical Exam Vitals and nursing note reviewed.  Constitutional:      Appearance: He is well-developed. He is not ill-appearing.  HENT:     Head: Normocephalic and atraumatic.  Eyes:     Pupils: Pupils are equal, round, and reactive to light.  Cardiovascular:      Rate and Rhythm: Tachycardia present. Rhythm irregular.     Heart sounds: Normal heart sounds. No murmur heard. Pulmonary:     Effort: Pulmonary effort is normal. No respiratory distress.     Breath sounds: Normal breath sounds. No wheezing.  Abdominal:     General: Bowel sounds are normal.     Palpations: Abdomen is soft.     Tenderness: There is no abdominal tenderness. There is no rebound.  Musculoskeletal:     Cervical back: Neck supple.     Right lower leg: No edema.     Left lower leg: No edema.  Lymphadenopathy:     Cervical: No cervical adenopathy.  Skin:    General: Skin is warm and dry.  Neurological:     Mental Status: He is alert and oriented to person, place, and time.  Psychiatric:        Mood and Affect: Mood normal.     (all labs ordered are listed, but only abnormal results are displayed) Labs Reviewed  BASIC METABOLIC PANEL WITH GFR - Abnormal; Notable for the following components:      Result Value   Glucose, Bld 117 (*)    All other components within normal limits  MAGNESIUM   CBC    EKG: EKG Interpretation Date/Time:  Sunday May 19 2024 06:27:26 EDT Ventricular Rate:  99 PR Interval:  100 QRS Duration:  107 QT Interval:  359 QTC Calculation: 412 R Axis:   69  Text Interpretation: Atrial fibrillation Paired ventricular premature complexes Low voltage, precordial leads ST elev, probable normal early repol pattern Rate slower Confirmed by Bari Pfeiffer ([id number]) on 05/19/2024 6:32:21 AM   EKG Interpretation Date/Time:  Sunday May 19 2024 06:49:54 EDT Ventricular Rate:  71 PR Interval:  145 QRS Duration:  111 QT Interval:  371 QTC Calculation: 404 R Axis:   69  Text Interpretation: Sinus rhythm Low voltage, precordial leads ST elev, probable normal early repol pattern Confirmed by Bari Pfeiffer ([id number]) on 05/19/2024 7:02:53 AM        Radiology: [facility] Chest Portable 1 View Result Date: 05/19/2024 CLINICAL DATA:  33 year old male with  syncope, black out. Tachycardia. Atrial fibrillation. EXAM: PORTABLE CHEST 1 VIEW COMPARISON:  Portable chest 05/24/2023 and earlier. FINDINGS: Portable AP semi upright view at 0622 hours. Normal lung volumes and mediastinal contours. Visualized tracheal air column is within normal limits. Allowing for portable technique the lungs are clear. No pneumothorax or pleural effusion. No osseous abnormality identified. Negative visible bowel gas. IMPRESSION: Negative portable chest. Electronically Signed   By: VEAR Hurst M.D.   On: 05/19/2024 06:57     .Cardioversion  Date/Time: 05/19/2024 6:55 AM  Performed by: Bari Pfeiffer FALCON, MD Authorized by: Bari Pfeiffer FALCON, MD   Consent:    Consent obtained:  Written   Consent given by:  Patient   Risks discussed:  Cutaneous burn, death and induced arrhythmia   Alternatives discussed:  No treatment and rate-control medication Pre-procedure details:    Cardioversion basis:  Emergent   Rhythm:  Atrial fibrillation   Electrode placement:  Anterior-posterior Patient sedated: Yes. Refer to sedation procedure documentation for details of sedation.  Attempt one:    Cardioversion mode:  Synchronous   Shock (Joules):  200   Shock outcome:  Conversion to normal sinus rhythm Post-procedure details:    Patient status:  Awake   Patient tolerance of procedure:  Tolerated well, no immediate complications .Sedation  Date/Time: 05/19/2024 6:55 AM  Performed by: Bari Pfeiffer FALCON, MD Authorized by: Bari Pfeiffer FALCON, MD   Consent:    Consent obtained:  Written   Consent given by:  Patient   Risks discussed:  Allergic reaction, dysrhythmia, inadequate sedation, nausea, prolonged hypoxia resulting in organ damage, prolonged sedation necessitating reversal, respiratory compromise necessitating ventilatory assistance and intubation and vomiting   Alternatives discussed:  Analgesia without sedation Universal protocol:    Immediately prior to procedure, a time out  was called: yes   Indications:    Procedure performed:  Cardioversion   Procedure necessitating sedation performed by:  Physician performing sedation Pre-sedation assessment:    Time since last food or drink:  2200   NPO status caution: urgency dictates proceeding with non-ideal NPO status     ASA classification: class 2 - patient with mild systemic disease     Mouth opening:  3 or more finger widths   Mallampati score:  I - soft palate, uvula, fauces, pillars visible   Neck mobility: normal     Pre-sedation assessments completed and reviewed: airway patency, cardiovascular function, hydration status, mental status, nausea/vomiting and pain level   A pre-sedation assessment was completed prior to the start of the procedure Immediate pre-procedure details:    Reassessment: Patient reassessed immediately prior to procedure     Reviewed: vital signs, relevant labs/tests and NPO status  Verified: bag valve mask available, emergency equipment available, intubation equipment available and IV patency confirmed   Procedure details (see MAR for exact dosages):    Preoxygenation:  Nasal cannula   Sedation:  Etomidate    Intended level of sedation: deep   Intra-procedure monitoring:  Blood pressure monitoring, continuous capnometry, continuous pulse oximetry and cardiac monitor   Intra-procedure events: none     Total Provider sedation time (minutes):  15 Post-procedure details:   A post-sedation assessment was completed following the completion of the procedure.   Attendance: Constant attendance by certified staff until patient recovered     Recovery: Patient returned to pre-procedure baseline     Post-sedation assessments completed and reviewed: airway patency, cardiovascular function, hydration status, mental status, nausea/vomiting, pain level, respiratory function and temperature     Patient is stable for discharge or admission: yes     Procedure completion:  Tolerated well, no immediate  complications .Critical Care  Performed by: Bari Charmaine FALCON, MD Authorized by: Bari Charmaine FALCON, MD   Critical care provider statement:    Critical care time (minutes):  35   Critical care was necessary to treat or prevent imminent or life-threatening deterioration of the following conditions: Arrhythmia, atrial fibrillation.   Critical care was time spent personally by me on the following activities:  Development of treatment plan with patient or surrogate, discussions with consultants, evaluation of patient's response to treatment, examination of patient, ordering and review of laboratory studies, ordering and review of radiographic studies, ordering and performing treatments and interventions, pulse oximetry, re-evaluation of patient's condition and review of old charts    Medications Ordered in the ED  apixaban  (ELIQUIS ) tablet 5 mg (has no administration in time range)  etomidate  (AMIDATE ) injection 10 mg (10 mg Intravenous See Procedure Record 05/19/24 0645)  apixaban  (ELIQUIS ) Education Kit for DVT/PE patients (has no administration in time range)  etomidate  (AMIDATE ) injection (11.99 mg Intravenous Given 05/19/24 0645)  diltiazem  (CARDIZEM ) 1 mg/mL load via infusion 15 mg (15 mg Intravenous Bolus from Bag 05/19/24 0609)    Clinical Course as of 05/19/24 0704  Sun May 19, 2024  0637 Patient is in rate controlled atrial fibrillation after diltiazem  load.  I have reviewed his Apple Watch data.  Given his objective data and onset of symptoms.  He is likely only been in atrial fibrillation since 4 AM.  I feel he is a good candidate for cardioversion as this is a similar presentation to his only other episode.  We discussed the risk and benefits including stroke, arrhythmia.  Patient would like to attempt cardioversion.  Diltiazem  infusion stopped in order to prevent postconversion bradycardia.  Patient was consented and will plan for sedation and cardioversion. [CH]  [phone] Successfully  cardioverted without any adverse effects.  Will be monitored. [CH]    Clinical Course User Index [CH] Seraphim Trow, Charmaine FALCON, MD                                 Medical Decision Making Amount and/or Complexity of Data Reviewed Labs: ordered. Radiology: ordered.  Risk Prescription drug management.   This patient presents to the ED for concern of palpitations, this involves an extensive number of treatment options, and is a complaint that carries with it a high risk of complications and morbidity.  I considered the following differential and admission for this acute, potentially life threatening condition.  The differential diagnosis includes arrhythmia, ACS, PE  MDM:    This is a 33 year old male who presents with recurrent palpitations.  Noted his heart rate to be in the 140s on his Apple Watch within the last 2 hours.  History of atrial fibrillation but no longer on Eliquis  after a 30-day heart monitor showed a low atrial fibrillation burden.  He is indeed in atrial fibrillation.  I have reviewed his Apple Watch data.  He is not having a chest pain or shortness of breath.  Just palpitations.  Does not appear to be provoked by anything although he has a reported history of OSA.  Discussed with the patient that I felt he was a very low risk for cardioversion given acute onset of symptoms and documentation from Apple Watch.  For this reason he was consented for cardioversion.  He was given a load of diltiazem  and was rate controlled but still in atrial fibrillation.  This was stopped prior to cardioversion.  Patient was cardioverted at the bedside.  No immediate complications noted.  Will restart Eliquis .  Basic labs reassuring.  Ambulatory referral sent to cardiology.  Patient will need to be monitored for a brief period of time post cardioversion.  If no issues, can be discharged home.  (Labs, imaging, consults)  Labs: I Ordered, and personally interpreted labs.  The pertinent results include:  Atrial fibrillation labs  Imaging Studies ordered: I ordered imaging studies including chest x-ray I independently visualized and interpreted imaging. I agree with the radiologist interpretation  Additional history obtained from chart review.  External records from outside source obtained and reviewed including cardiology notes  Cardiac Monitoring: The patient was maintained on a cardiac monitor.  If on the cardiac monitor, I personally viewed and interpreted the cardiac monitored which showed an underlying rhythm of: Atrial fibrillation  Reevaluation: After the interventions noted above, I reevaluated the patient and found that they have :resolved  Social Determinants of Health:  lives independently  Disposition: Being monitored, anticipate discharge, signed out to oncoming provider  Co morbidities that complicate the patient evaluation  Past Medical History:  Diagnosis Date   Frequent headaches    GERD (gastroesophageal reflux disease)    OSA on CPAP 03/11/2015   Dr francis clance.    Vertigo      Medicines Meds ordered this encounter  Medications   diltiazem  (CARDIZEM ) 1 mg/mL load via infusion 15 mg   DISCONTD: diltiazem  (CARDIZEM ) 125 mg in dextrose  5% 125 mL (1 mg/mL) infusion   apixaban  (ELIQUIS ) tablet 5 mg   etomidate  (AMIDATE ) injection 10 mg   apixaban  (ELIQUIS ) Education Kit for DVT/PE patients   apixaban  (ELIQUIS ) 5 MG TABS tablet    Sig: Take 1 tablet (5 mg total) by mouth 2 (two) times daily.    Dispense:  60 tablet    Refill:  0   etomidate  (AMIDATE ) injection    I have reviewed the patients home medicines and have made adjustments as needed  Problem List / ED Course: Problem List Items Addressed This Visit   None Visit Diagnoses       Atrial fibrillation with RVR (HCC)    -  Primary   Relevant Medications   diltiazem  (CARDIZEM ) 1 mg/mL load via infusion 15 mg (Completed)   apixaban  (ELIQUIS ) tablet 5 mg   apixaban  (ELIQUIS ) 5 MG TABS tablet                 Final diagnoses:  Atrial fibrillation with RVR [facility])    ED Discharge Orders  Ordered    Amb referral to AFIB Clinic        05/19/24 0601    apixaban  (ELIQUIS ) 5 MG TABS tablet  2 times daily        05/19/24 0654               Anush Wiedeman, Charmaine FALCON, MD 05/19/24 [phone]

## 2024-06-11 ENCOUNTER — Ambulatory Visit: Attending: Internal Medicine | Admitting: Internal Medicine

## 2024-06-11 ENCOUNTER — Encounter: Payer: Self-pay | Admitting: Internal Medicine

## 2024-06-11 VITALS — BP 102/60 | HR 50 | Ht 69.0 in | Wt 182.4 lb

## 2024-06-11 DIAGNOSIS — Z889 Allergy status to unspecified drugs, medicaments and biological substances status: Secondary | ICD-10-CM | POA: Insufficient documentation

## 2024-06-11 DIAGNOSIS — I4819 Other persistent atrial fibrillation: Secondary | ICD-10-CM | POA: Diagnosis not present

## 2024-06-11 DIAGNOSIS — I480 Paroxysmal atrial fibrillation: Secondary | ICD-10-CM | POA: Diagnosis not present

## 2024-06-11 MED ORDER — RIVAROXABAN 20 MG PO TABS: 20.0000 mg | ORAL_TABLET | Freq: Every day | ORAL | 5 refills | Status: AC

## 2024-06-11 NOTE — Patient Instructions (Signed)
 Medication Instructions:  Your physician has recommended you make the following change in your medication:  Stop taking Eliquis   Start taking Xarelto  20 mg daily at supper Continue taking all other medications as prescribed   Labwork: None  Testing/Procedures: Your physician has recommended that you have a sleep study. This test records several body functions during sleep, including: brain activity, eye movement, oxygen and carbon dioxide blood levels, heart rate and rhythm, breathing rate and rhythm, the flow of air through your mouth and nose, snoring, body muscle movements, and chest and belly movement.   Follow-Up: Your physician recommends that you schedule a follow-up appointment in: Follow up as needed  Any Other Special Instructions Will Be Listed Below (If Applicable). Referral to Electrophysiology   Thank you for choosing [facility]!     If you need a refill on your cardiac medications before your next appointment, please call your pharmacy.

## 2024-06-11 NOTE — Progress Notes (Signed)
 Cardiology Office Note  Date: 06/11/2024   ID: KIELAN DREISBACH, DOB [birth year], MRN [id number]  PCP:  Marvine Rush, MD  Cardiologist:  Diannah SHAUNNA Maywood, MD Electrophysiologist:  None   History of Present Illness: Arthur Collins is a 33 y.o. male known to have persistent A-fib s/p DCCV in August 2024 and 2025 is here for post ER visit.  Presented to ER on 05/24/2023 with A-fib with RVR/palpitations and underwent successful DCCV with conversion to NSR.  After DCCV, he had erythema and burning at the position of the electrode.  He presented to ER again in August 2025 with similar symptoms of palpitations and underwent DCCV.  He is here today for post ER visit.  Patient stopped taking Eliquis  as it made him feel very cold and sleepy.  This happened in August 2024 as well after his cardioversion and he stopped taking it.  Currently not on any Eliquis .  Not on any rate controlling agents.  EKG today showed sinus bradycardia, HR 54 bpm.  I reviewed his smart watch data that showed HR 160 bpm at least once a month in the last 1 year.  This was at rest.  He was symptomatic with palpitations at that time.  Usually it goes away after some time but recently prior to the ER visit in August 2025, it persisted and hence visited ER.  Past Medical History:  Diagnosis Date   Frequent headaches    GERD (gastroesophageal reflux disease)    OSA on CPAP 03/11/2015   Dr francis clance.    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 (Patient not taking: Reported on 06/11/2024) 60 tablet 0   No current facility-administered medications for this visit.   Allergies:  Patient has no known allergies.    Social History: The patient  reports that he has been smoking cigarettes. He has never used smokeless tobacco. He reports current alcohol use. He reports that he does not use drugs.   Family History: The patient's family history includes Diabetes in his mother; Epilepsy in his brother; Healthy in his brother, brother, brother, brother, and maternal grandmother; Hypertension in his mother; Other in his father.   ROS:  Please see the history of present illness. Otherwise, complete review of systems is positive for none  All other systems are reviewed and negative.   Physical Exam: VS:  BP 102/60   Pulse (!) 50   Ht 5' 9 (1.753 m)   Wt 182 lb 6.4 oz (82.7 kg)   SpO2 99%   BMI 26.94 kg/m , BMI Body mass index is 26.94 kg/m.  Wt Readings from Last 3 Encounters:  06/11/24 182 lb 6.4 oz (82.7 kg)  05/19/24 178 lb (80.7 kg)  05/29/23 188 lb 6.4 oz (85.5 kg)    General: Patient appears comfortable at rest. HEENT: Conjunctiva and lids normal, oropharynx clear with moist mucosa. Neck: Supple, no elevated JVP or carotid bruits, no thyromegaly. Lungs: Clear to auscultation, nonlabored breathing at rest. Cardiac: Regular rate and rhythm, no S3 or significant systolic murmur, no pericardial rub. Abdomen: Soft, nontender, no hepatomegaly, bowel sounds present, no guarding or rebound. Extremities:  No pitting edema, distal pulses 2+. Skin: Warm and dry. Musculoskeletal: No kyphosis. Neuropsychiatric: Alert and oriented x3, affect grossly appropriate.  Recent Labwork: 05/19/2024: BUN 9; Creatinine, Ser 0.67; Hemoglobin 14.8; Magnesium  2.4; Platelets 232; Potassium 3.8; Sodium 137     Component Value Date/Time   CHOL 155 06/01/2015 0936   TRIG 99 06/01/2015 0936   HDL 39 (L) 06/01/2015 0936   CHOLHDL 4.0 06/01/2015 0936   VLDL 20 06/01/2015 0936   LDLCALC 96 06/01/2015 0936     Assessment and Plan:  Persistent A-fib s/p DCCV in 05/2023 and 05/2024: EKG today showed sinus bradycardia with  HR 54 bpm.  Presented with palpitations to ER in August 2024 and 2025 requiring DCCV.  He was discharged from the ER on Eliquis  5 mg twice daily however patient stopped taking it due to medication making him feel very cold and sleepy.  He has no issues with Eliquis  in August 2024 as well after his cardioversion.  I reviewed his Apple Watch, he had HR 160 bpm at rest at least once a month, likely in A-fib because he was symptomatic with palpitations at that time.  Due to sinus bradycardia on today's EKG, cannot start any scheduled rate controlling agents.  Will stop Eliquis  and start Xarelto  20 mg daily with supper. Due to frequent paroxysmal atrial fibrillation with RVR requiring DCCV, he will strongly benefit from PVI. Will place referral to electrophysiology.  Echocardiogram in 2024 showed normal LVEF and no valvular heart disease.  Diagnosed with OSA in the past prior to gastric bypass surgery, will obtain split-night sleep study.  Drug allergy: Low risk, Eliquis  made him feel sleepy and cold.  As he is a working Radiographer, therapeutic, he stopped taking Eliquis  both in 2024 and 2025 after his cardioversion.  OSA: Prior to his gastric bypass surgery, he was diagnosed with OSA.  No recent sleep study.  Will obtain split night sleep study.   30 minutes spent in doing the prior ER notes, medications, more than 3 labs, discussion of persistent A-fib and OSA with the patient.  Medication Adjustments/Labs and Tests Ordered: Current medicines are reviewed at length with the patient today.  Concerns regarding medicines are outlined above.    Disposition:  Follow up as needed (can follow-up with electrophysiology from now on)  Signed Devontaye Ground Arleta Maywood, MD, 06/11/2024 9:48 AM    [facility] at [facility] [address] [place], [place], [place] [address]

## 2024-06-14 ENCOUNTER — Emergency Department (HOSPITAL_COMMUNITY)
Admission: EM | Admit: 2024-06-14 | Discharge: 2024-06-14 | Disposition: A | Discharge status: Home / Self Care | Attending: Emergency Medicine | Admitting: Emergency Medicine

## 2024-06-14 ENCOUNTER — Other Ambulatory Visit: Payer: Self-pay

## 2024-06-14 ENCOUNTER — Telehealth: Admitting: Physician Assistant

## 2024-06-14 ENCOUNTER — Emergency Department (HOSPITAL_COMMUNITY)

## 2024-06-14 ENCOUNTER — Encounter (HOSPITAL_COMMUNITY): Payer: Self-pay | Admitting: *Deleted

## 2024-06-14 DIAGNOSIS — Z7901 Long term (current) use of anticoagulants: Secondary | ICD-10-CM | POA: Diagnosis not present

## 2024-06-14 DIAGNOSIS — R519 Headache, unspecified: Secondary | ICD-10-CM

## 2024-06-14 DIAGNOSIS — G4485 Primary stabbing headache: Secondary | ICD-10-CM | POA: Diagnosis not present

## 2024-06-14 DIAGNOSIS — I4819 Other persistent atrial fibrillation: Secondary | ICD-10-CM

## 2024-06-14 MED ORDER — ACETAMINOPHEN 500 MG PO TABS
1000.0000 mg | ORAL_TABLET | Freq: Once | ORAL | Status: AC
  Administered 2024-06-14: 1000 mg via ORAL
  Filled 2024-06-14: qty 2

## 2024-06-14 MED ORDER — METOCLOPRAMIDE HCL 5 MG/ML IJ SOLN
10.0000 mg | Freq: Once | INTRAMUSCULAR | Status: AC
  Administered 2024-06-14: 10 mg via INTRAMUSCULAR
  Filled 2024-06-14: qty 2

## 2024-06-14 NOTE — ED Triage Notes (Signed)
 Pt instructed to come to ER for HA and possible head bleed. Pt with hx of Afib and was placed on blood thinners. Pt states HA since this morning. HA on right side. Pt was on Eliquis  and switched to Xalerto 3 days ago. + nausea

## 2024-06-14 NOTE — ED Provider Notes (Signed)
 [place] EMERGENCY DEPARTMENT AT [facility] Provider Note   CSN: [id number] Arrival date & time: 06/14/24  1821     Patient presents with: Headache   MONISH HALIBURTON is a 33 y.o. male.   HPI    33 year old patient comes in with chief complaint of headache. Patient has history of headaches, including migraines.  However he states that his current headache is throbbing in nature, over the right crown region.  The headache is different than the headache she has experienced.  Currently pain is 5 out of 10.  Pain started at 4 AM yesterday while he was at work.  He took medications without significant relief.  Patient was a started on Eliquis  because of A-fib.  He did a telehealth visit, they advised that he come to the ER because of new headache in the setting of patient being on anticoagulation recently.  Pt has no associated nausea, vomiting, seizures, loss of consciousness or new visual complains, weakness, numbness, dizziness or gait instability.   Prior to Admission medications   Medication Sig Start Date End Date Taking? Authorizing Provider  rivaroxaban  (XARELTO ) 20 MG TABS tablet Take 1 tablet (20 mg total) by mouth daily with supper. 06/11/24   Mallipeddi, Vishnu P, MD  esomeprazole  (NEXIUM ) 20 MG packet Take 20 mg by mouth daily before breakfast. 06/01/15 06/25/20  Christopher Savannah, PA-C    Allergies: Eliquis  [apixaban ]    Review of Systems  All other systems reviewed and are negative.   Updated Vital Signs BP 109/63 (BP Location: Right Arm)   Pulse (!) 59   Temp 97.9 F (36.6 C) (Oral)   Resp 17   Ht 5' 9 (1.753 m)   Wt 80.3 kg   SpO2 99%   BMI 26.14 kg/m   Physical Exam Vitals and nursing note reviewed.  Constitutional:      Appearance: He is well-developed.  HENT:     Head: Atraumatic.  Eyes:     General: No visual field deficit.    Extraocular Movements: Extraocular movements intact.     Pupils: Pupils are equal, round, and reactive to light.   Neck:     Comments: No meningismus Cardiovascular:     Rate and Rhythm: Normal rate.  Pulmonary:     Effort: Pulmonary effort is normal.  Musculoskeletal:     Cervical back: Neck supple. No rigidity.  Skin:    General: Skin is warm.  Neurological:     Mental Status: He is alert and oriented to person, place, and time.     GCS: GCS eye subscore is 4. GCS verbal subscore is 5. GCS motor subscore is 6.     Cranial Nerves: No cranial nerve deficit, dysarthria or facial asymmetry.     Sensory: No sensory deficit.     Motor: No weakness.     (all labs ordered are listed, but only abnormal results are displayed) Labs Reviewed - No data to display  EKG: None  Radiology: CT Head Wo Contrast Result Date: 06/14/2024 CLINICAL DATA:  Headache, increasing frequency or severity EXAM: CT HEAD WITHOUT CONTRAST TECHNIQUE: Contiguous axial images were obtained from the base of the skull through the vertex without intravenous contrast. RADIATION DOSE REDUCTION: This exam was performed according to the departmental dose-optimization program which includes automated exposure control, adjustment of the mA and/or kV according to patient size and/or use of iterative reconstruction technique. COMPARISON:  Head CT 02/27/2015, brain MRI 03/06/2015 FINDINGS: Brain: No intracranial hemorrhage, mass effect, or midline  shift. No hydrocephalus. The basilar cisterns are patent. No evidence of territorial infarct or acute ischemia. No extra-axial or intracranial fluid collection. Vascular: No hyperdense vessel or unexpected calcification. Skull: No fracture or focal lesion. Sinuses/Orbits: Paranasal sinuses and mastoid air cells are clear. The visualized orbits are unremarkable. Other: Unremarkable scalp soft tissues. IMPRESSION: Negative noncontrast head CT. Electronically Signed   By: Andrea Gasman M.D.   On: 06/14/2024 20:48     Procedures   Medications Ordered in the ED  metoCLOPramide  (REGLAN ) injection 10  mg (10 mg Intramuscular Given 06/14/24 2310)  acetaminophen  (TYLENOL ) tablet 1,000 mg (1,000 mg Oral Given 06/14/24 2307)                                    Medical Decision Making Risk OTC drugs. Prescription drug management.    33 year old patient comes in with cc of headaches. Pertinent past medical includes A-fib, previous history of recurrent headaches, migraines. Collateral history provided by reviewing patient's records, including the ED visit here earlier today.  Patient also had an MRI in 2016 of his brain which was negative.  Differential diagnosis consideration for this patient includes: Primary headaches - including migrainous headaches, cluster headaches, tension headaches. ICH Vascular headaches AV malformation Brain aneurysm Muscular headaches  Assessment and plan: Patient comes in with chief complaint of headache.  He is nontoxic-appearing.  No focal neurodeficits.  Headaches are rated at 5 out of 10 at this time.  At no point where the headaches excruciating.  CT scan of the brain was ordered and independently interpreted by me.  No evidence of brain bleed.  Will give him IM Reglan .  Stable for discharge.  Final diagnoses:  Primary stabbing headache    ED Discharge Orders     None          Charlyn Sora, MD 06/14/24 2334

## 2024-06-14 NOTE — Patient Instructions (Signed)
  Arthur Collins, thank you for joining Delon CHRISTELLA Dickinson, PA-C for today's virtual visit.  While this provider is not your primary care provider (PCP), if your PCP is located in our provider database this encounter information will be shared with them immediately following your visit.   A [facility] MyChart account gives you access to today's visit and all your visits, tests, and labs performed at [facility]  click here if you don't have a [facility] MyChart account or go to [email or web address]  Consent: (Patient) Arthur Collins provided verbal consent for this virtual visit at the beginning of the encounter.  Current Medications:  Current Outpatient Medications:    rivaroxaban  (XARELTO ) 20 MG TABS tablet, Take 1 tablet (20 mg total) by mouth daily with supper., Disp: 30 tablet, Rfl: 5   Medications ordered in this encounter:  No orders of the defined types were placed in this encounter.    *If you need refills on other medications prior to your next appointment, please contact your pharmacy*  Follow-Up: Call back or seek an in-person evaluation if the symptoms worsen or if the condition fails to improve as anticipated.  [facility] [phone]  Other Instructions Please proceed to closest ER facility   If you have been instructed to have an in-person evaluation today at a local Urgent Care facility, please use the link below. It will take you to a list of all of our available [facility], including address, phone number and hours of operation. Please do not delay care.  [facility]  If you or a family member do not have a primary care provider, use the link below to schedule a visit and establish care. When you choose a [facility] primary care physician or advanced practice provider, you gain a long-term partner in health. Find a Primary Care Provider  Learn more about [facility]'s in-office and virtual care  options: [facility] - Get Care Now

## 2024-06-14 NOTE — Discharge Instructions (Signed)
 You were seen in the emergency room for headaches.  The CT scan is not showing any evidence of brain bleed.  Continue to take Tylenol  every 6 hours for pain control.  Follow-up with your primary care doctor for further assessment.  Please return to the ER if the headache gets severe and in not improving, you have associated new one sided numbness, tingling, weakness or confusion, seizures, poor balance or poor vision.

## 2024-06-14 NOTE — Progress Notes (Signed)
 Virtual Visit Consent   Arthur Collins, you are scheduled for a virtual visit with a [facility] provider today. Just as with appointments in the office, your consent must be obtained to participate. Your consent will be active for this visit and any virtual visit you may have with one of our providers in the next 365 days. If you have a MyChart account, a copy of this consent can be sent to you electronically.  As this is a virtual visit, video technology does not allow for your provider to perform a traditional examination. This may limit your provider's ability to fully assess your condition. If your provider identifies any concerns that need to be evaluated in person or the need to arrange testing (such as labs, EKG, etc.), we will make arrangements to do so. Although advances in technology are sophisticated, we cannot ensure that it will always work on either your end or our end. If the connection with a video visit is poor, the visit may have to be switched to a telephone visit. With either a video or telephone visit, we are not always able to ensure that we have a secure connection.  By engaging in this virtual visit, you consent to the provision of healthcare and authorize for your insurance to be billed (if applicable) for the services provided during this visit. Depending on your insurance coverage, you may receive a charge related to this service.  I need to obtain your verbal consent now. Are you willing to proceed with your visit today? Arthur Collins has provided verbal consent on 06/14/2024 for a virtual visit (video or telephone). Arthur CHRISTELLA Dickinson, Arthur Collins  Date: 06/14/2024 4:55 PM   Virtual Visit via Video Note   I, Arthur Collins, connected with  Arthur Collins  ([id number], 08/21/1991) on 06/14/24 at  4:45 PM EDT by a video-enabled telemedicine application and verified that I am speaking with the correct person using two identifiers.  Location: Patient: Virtual Visit  Location Patient: Home Provider: Virtual Visit Location Provider: Home Office   I discussed the limitations of evaluation and management by telemedicine and the availability of in person appointments. The patient expressed understanding and agreed to proceed.    History of Present Illness: Arthur Collins is a 33 y.o. who identifies as a male who was assigned male at birth, and is being seen today for headache.  HPI: Migraine  This is a new problem. The current episode started yesterday. The problem occurs constantly. The problem has been unchanged. The pain is located in the Parietal and right unilateral region. The pain does not radiate. The pain quality is not similar to prior headaches. The quality of the pain is described as sharp and stabbing. The pain is severe.    Was started on Xarelto  on 06/11/24 by Cardiology for Atrial Fibrillation.   Had a severe, stabbing headache over the right parietal area start yesterday and has not subsided and is gradually worsening. Reports it does not feel like any previous headache.   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   Non-cardiac chest pain 06/01/2015   Obesity 06/01/2015   Esophageal reflux 06/01/2015   PND (paroxysmal nocturnal dyspnea) 06/01/2015   Intermittent palpitations 05/12/2015   Heart murmur, systolic 05/12/2015   Intractable migraine with aura with status migrainosus 03/18/2015   Clamminess 03/11/2015   Headache behind the eyes 03/11/2015   Dysesthesia of multiple sites 03/11/2015    Benign paroxysmal positional vertigo 03/11/2015   OSA (obstructive sleep apnea) 03/11/2015    Allergies:  Allergies  Allergen Reactions   Eliquis  [Apixaban ] Other (See Comments)    Made him feel cold and sleepy   Medications:  Current Outpatient Medications:    rivaroxaban  (XARELTO ) 20 MG TABS tablet, Take 1 tablet (20 mg total) by mouth daily with supper., Disp: 30 tablet, Rfl: 5  Observations/Objective: Patient is well-developed, well-nourished in no acute distress.  Resting comfortably at home.  Head is normocephalic, atraumatic.  No labored breathing.  Speech is clear and coherent with logical content.  Patient is alert and oriented at baseline.    Assessment and Plan: There are no diagnoses linked to this encounter. - Due to new start Xarelto  with new onset headache he should be evaluated emergently to rule out any potential brain bleed from new start blood thinner  Recommend immediate ER evaluation  Follow Up Instructions: I discussed the assessment and treatment plan with the patient. The patient was provided an opportunity to ask questions and all were answered. The patient agreed with the plan and demonstrated an understanding of the instructions.  A copy of instructions were sent to the patient via MyChart unless otherwise noted below.    The patient was advised to call back or seek an in-person evaluation if the symptoms worsen or if the condition fails to improve as anticipated.    Arthur CHRISTELLA Dickinson, Arthur Collins

## 2024-07-15 ENCOUNTER — Ambulatory Visit: Attending: Cardiovascular Disease | Admitting: Cardiovascular Disease

## 2024-07-15 ENCOUNTER — Encounter: Payer: Self-pay | Admitting: Cardiovascular Disease

## 2024-07-15 VITALS — BP 100/68 | HR 59 | Ht 69.0 in | Wt 181.1 lb

## 2024-07-15 DIAGNOSIS — I4819 Other persistent atrial fibrillation: Secondary | ICD-10-CM | POA: Diagnosis not present

## 2024-07-15 DIAGNOSIS — Z01812 Encounter for preprocedural laboratory examination: Secondary | ICD-10-CM

## 2024-07-15 NOTE — Progress Notes (Signed)
 Electrophysiology Office Note:    Date:  07/15/2024   ID:  Arthur Collins, DOB 1991/03/04, MRN [id number]  PCP:  Marvine Rush, MD   [facility] Providers Cardiologist:  Diannah SHAUNNA Maywood, MD     Referring MD: Maywood Diannah SHAUNNA, MD   History of Present Illness:    Arthur Collins is a 33 y.o. male with a medical history significant for atrial fibrillation sepsis DC cardioversion in August 2024 in 2025, referred for management of atrial fibrillation.     Discussed the use of AI scribe software for clinical note transcription with the patient, who gave verbal consent to proceed.  History of Present Illness Arthur Collins is a 33 year old male with atrial fibrillation who presents for evaluation of his condition.  He experiences daily episodes of atrial fibrillation with heart rates ranging from 40 to 180 beats per minute, as recorded by his Apple Watch. These episodes often make him feel like he is going to pass out. He has been on blood thinners for the past 30 days and is concerned about missing doses.  He finds it difficult to regulate his heart rate during exercise, which often triggers his atrial fibrillation. This causes him to feel faint, necessitating that he lie down. He is interested in maintaining physical fitness but struggles with these symptoms.         Today, he reports that he feels well and has no acute complaints.  EKGs/Labs/Other Studies Reviewed Today:     Echocardiogram:  TTE August 2024 LVEF 60 to 65%.  Normal RV systolic function.  Mitral valve normal in structure.  Aortic valve has an indeterminate number of cusps but no stenosis or regurgitation is visualized.   EKG:   EKG Interpretation Date/Time:  Monday July 15 2024 15:10:23 EDT Ventricular Rate:  59 PR Interval:  132 QRS Duration:  90 QT Interval:  372 QTC Calculation: 368 R Axis:   44  Text Interpretation: Sinus bradycardia Early repolarization pattern When  compared with ECG of 11-Jun-2024 09:49, No significant change was found Confirmed by Nancey Scotts [phone]) on 07/15/2024 3:18:25 PM     Physical Exam:    VS:  BP 100/68   Pulse (!) 59   Ht 5' 9 (1.753 m)   Wt 181 lb 1.6 oz (82.1 kg)   SpO2 98%   BMI 26.74 kg/m     Wt Readings from Last 3 Encounters:  07/15/24 181 lb 1.6 oz (82.1 kg)  06/14/24 177 lb (80.3 kg)  06/11/24 182 lb 6.4 oz (82.7 kg)     GEN: Well nourished, well developed in no acute distress CARDIAC: RRR, no murmurs, rubs, gallops RESPIRATORY:  Normal work of breathing MUSCULOSKELETAL: no edema    ASSESSMENT & PLAN:     Persistent atrial fibrillation Symptomatic, multiple ER visits Multiple cardioversions We discussed management options, and using a shared decision approach, decided to treat with A-fib ablation scheduling. He will need a CT. Either the Carto system or the Opal system may be used  We discussed the indication, rationale, logistics, anticipated benefits, and potential risks of the ablation procedure including but not limited to -- bleed at the groin access site, chest pain, damage to nearby organs such as the diaphragm, lungs, or esophagus, need for a drainage tube, or prolonged hospitalization. I explained that the risk for stroke, heart attack, need for open chest surgery, or even death is very low but not zero. he  expressed understanding and wishes to proceed.  Secondary hypercoagulable state CHA2DS2-VASc score is 0   Obesity He has had a gastric bypass surgery I suspect his A-fib is due to history of morbid obesity and prior sleep apnea  Sleep apnea History of sleep apnea prior to significant weight lost Dr. Bettyjo has requested repeat sleep study   Signed, Eulas FORBES Furbish, MD  07/15/2024 4:34 PM    [facility]

## 2024-07-15 NOTE — Patient Instructions (Signed)
 Medication Instructions:  Your physician recommends that you continue on your current medications as directed. Please refer to the Current Medication list given to you today.  *If you need a refill on your cardiac medications before your next appointment, please call your pharmacy*  Testing/Procedures: Ablation Your physician has recommended that you have an ablation. Catheter ablation is a medical procedure used to treat some cardiac arrhythmias (irregular heartbeats). During catheter ablation, a long, thin, flexible tube is put into a blood vessel in your groin (upper thigh), or neck. This tube is called an ablation catheter. It is then guided to your heart through the blood vessel. Radio frequency waves destroy small areas of heart tissue where abnormal heartbeats may cause an arrhythmia to start.   You are scheduled for Atrial Fibrillation Ablation on Tuesday, November 11 with Dr. Dr. Nancey. Please arrive at the Main Entrance A at [facility]: [address] [place], [place] [address] at 10:30 AM   What To Expect:  Labs: you will need to have lab work drawn within 30 days of your procedure. Please go to any [facility] location to have these drawn - no appointment is needed. Cardiac CT Scan: this will be done about 3-4 weeks prior to your procedure. You will be contacted to schedule this test. You will receive procedure instructions either through MyChart or in the mail 4-6 week prior to your procedure.  After your procedure we recommend no driving for 4 days, no lifting over 5 lbs for 7 days, and no work or strenuous activity for 7 days.  Please contact our office at [phone] if you have any questions.    Follow-Up: We will contact you to schedule your post-procedure appointments.  Your Cardiac CT has been scheduled on            at the following location:  [facility] [address]  [place], [place] [address]  Please enter the parking lot using  the [address] entrance and use the FREE valet service at the patient drop-off area. Enter the buidling and check-in with registration on the main floor.  Please follow these instructions carefully (unless otherwise directed):  An IV will be required for this test.  On the Night Before the Test: Be sure to Drink plenty of water. Do not consume any caffeinated/decaffeinated beverages or chocolate 12 hours prior to your test. Do not take any antihistamines 12 hours prior to your test.   On the Day of the Test: Drink plenty of water until 1 hour prior to the test. Do not eat any food 1 hour prior to test. You may take your regular medications prior to the test.  If you take Furosemide/Hydrochlorothiazide/Spironolactone/Chlorthalidone, please HOLD on the morning of the test. Patients who wear a continuous glucose monitor MUST remove the device prior to scanning. FEMALES- please wear underwire-free bra if available, avoid dresses & tight clothing  After the Test: Drink plenty of water. After receiving IV contrast, you may experience a mild flushed feeling. This is normal. On occasion, you may experience a mild rash up to 24 hours after the test. This is not dangerous. If this occurs, you can take Benadryl  25 mg, Zyrtec, Claritin, or Allegra and increase your fluid intake. (Patients taking Tikosyn should avoid Benadryl , and may take Zyrtec, Claritin, or Allegra) If you experience trouble breathing, this can be serious. If it is severe call 911 IMMEDIATELY. If it is mild, please call our office.  For more  information and frequently asked questions, please visit our website : [email or web address]  For non-scheduling related questions, please contact the cardiac imaging nurse navigator should you have any questions/concerns: Cardiac Imaging Nurse Navigators Direct Office Dial: [phone]   For scheduling needs, including cancellations and rescheduling, please call [place],  [phone].

## 2024-08-05 ENCOUNTER — Encounter (HOSPITAL_COMMUNITY): Payer: Self-pay

## 2024-08-06 ENCOUNTER — Telehealth (HOSPITAL_COMMUNITY): Payer: Self-pay

## 2024-08-06 ENCOUNTER — Encounter (HOSPITAL_COMMUNITY): Payer: Self-pay

## 2024-08-06 ENCOUNTER — Ambulatory Visit (HOSPITAL_COMMUNITY)
Admission: RE | Admit: 2024-08-06 | Discharge: 2024-08-06 | Disposition: A | Discharge status: Home / Self Care | Source: Ambulatory Visit | Attending: Cardiovascular Disease | Admitting: Cardiovascular Disease

## 2024-08-06 DIAGNOSIS — I4819 Other persistent atrial fibrillation: Secondary | ICD-10-CM | POA: Diagnosis present

## 2024-08-06 MED ORDER — IOHEXOL 350 MG/ML SOLN
100.0000 mL | Freq: Once | INTRAVENOUS | Status: AC | PRN
Start: 2024-08-06 — End: 2024-08-06
  Administered 2024-08-06: 100 mL via INTRAVENOUS

## 2024-08-06 MED ORDER — DIPHENHYDRAMINE HCL 25 MG PO CAPS
25.0000 mg | ORAL_CAPSULE | Freq: Once | ORAL | Status: AC
Start: 2024-08-06 — End: 2024-08-06
  Administered 2024-08-06: 25 mg via ORAL

## 2024-08-06 MED ORDER — DIPHENHYDRAMINE HCL 25 MG PO CAPS
ORAL_CAPSULE | ORAL | Status: AC
  Filled 2024-08-06: qty 1

## 2024-08-06 NOTE — Telephone Encounter (Signed)
 Spoke with patient to complete pre-procedure call.     Health status review:  Any new medical conditions, recent signs of acute illness or been started on antibiotics? No Any recent hospitalizations or surgeries? No Any new medications started since pre-op visit? No  Follow all medication instructions prior to procedure or the procedure may be rescheduled:    Continue taking Xarelto  (Rivaroxaban ) once daily without missing any doses before procedure. Essential chronic medications:  No medication should be continued, unless told otherwise. On the morning of your procedure DO NOT take any medication., including Xarelto  (Rivaroxaban ).  Nothing to eat or drink after midnight prior to your procedure.  Pre-procedure testing scheduled: CT and lab work completed on October 21.  Confirmed patient is scheduled for Atrial Fibrillation Ablation on Tuesday, November 11 with Dr. Dr. Nancey. Instructed patient to arrive at the Main Entrance A at [facility]: [address] [place], [place] [address] and check in at Admitting at 10:00 AM.  Plan to go home the same day, you will only stay overnight if medically necessary.. You MUST have a responsible adult to drive you home and MUST be with you the first 24 hours after you arrive home or your procedure could be cancelled.  Informed patient a nurse will call a day before the procedure to confirm arrival time and ensure instructions are followed.  Patient verbalized understanding to information provided and is agreeable to proceed with procedure.   Advised patient to contact RN Navigator at [phone], to inform of any new medications started after call or concerns prior to procedure.

## 2024-08-06 NOTE — Progress Notes (Signed)
 Patient presented for a cardiac CT scan with contrast. After the study, patient report itching.  No rash, hives, or difficulty breathing noted. Dr. Barbaraann was called and he recommended that the patient get 25mg  PO benadryl .    Tablet was given to the patient.  Patient had an hour drive to get home so he would take the tablet when he got home.

## 2024-08-07 LAB — CBC
Hematocrit: 42.5 % (ref 37.5–51.0)
Hemoglobin: 13.6 g/dL (ref 13.0–17.7)
MCH: 29.4 pg (ref 26.6–33.0)
MCHC: 32 g/dL (ref 31.5–35.7)
MCV: 92 fL (ref 79–97)
Platelets: 223 x10E3/uL (ref 150–450)
RBC: 4.63 x10E6/uL (ref 4.14–5.80)
RDW: 12.2 % (ref 11.6–15.4)
WBC: 5.2 x10E3/uL (ref 3.4–10.8)

## 2024-08-07 LAB — BASIC METABOLIC PANEL WITH GFR
BUN/Creatinine Ratio: 14 (ref 9–20)
BUN: 11 mg/dL (ref 6–20)
CO2: 24 mmol/L (ref 20–29)
Calcium: 9.5 mg/dL (ref 8.7–10.2)
Chloride: 102 mmol/L (ref 96–106)
Creatinine, Ser: 0.78 mg/dL (ref 0.76–1.27)
Glucose: 80 mg/dL (ref 70–99)
Potassium: 3.9 mmol/L (ref 3.5–5.2)
Sodium: 138 mmol/L (ref 134–144)
eGFR: 121 mL/min/1.73 (ref >59)

## 2024-08-08 ENCOUNTER — Ambulatory Visit: Payer: Self-pay | Admitting: Cardiovascular Disease

## 2024-08-20 ENCOUNTER — Encounter: Payer: Self-pay | Admitting: Cardiovascular Disease

## 2024-08-20 DIAGNOSIS — Z0279 Encounter for issue of other medical certificate: Secondary | ICD-10-CM

## 2024-08-21 ENCOUNTER — Telehealth: Payer: Self-pay | Admitting: Cardiovascular Disease

## 2024-08-21 NOTE — Telephone Encounter (Signed)
 I received three forms today. One Disability and two FMLA forms.  Patient has two employers.  Patient signed the Release of information for each form and paid $29 form fee for each.  Forms in Dr. Marko box.

## 2024-08-26 NOTE — Pre-Procedure Instructions (Signed)
 Instructed patient on the following items: Arrival time 0930 Nothing to eat or drink after midnight No meds AM of procedure Responsible person to drive you home and stay with you for 24 hrs  Have you missed any doses of anti-coagulant Xarelto - takes once a day, hasn't missed any doses in last 4 weeks.

## 2024-08-26 NOTE — Anesthesia Preprocedure Evaluation (Signed)
 Anesthesia Evaluation  Patient identified by MRN, date of birth, ID band Patient awake    Reviewed: Allergy & Precautions, NPO status , Patient's Chart, lab work & pertinent test results  History of Anesthesia Complications Negative for: history of anesthetic complications  Airway Mallampati: II  TM Distance: >3 FB Neck ROM: Full    Dental  (+) Dental Advisory Given   Pulmonary neg shortness of breath, sleep apnea and Continuous Positive Airway Pressure Ventilation , neg COPD, neg recent URI, Current Smoker   Pulmonary exam normal breath sounds clear to auscultation       Cardiovascular (-) hypertension(-) angina (-) Past MI, (-) Cardiac Stents and (-) CABG + dysrhythmias (on xarelto ) Atrial Fibrillation + Valvular Problems/Murmurs  Rhythm:Irregular Rate:Abnormal     Neuro/Psych  Headaches, neg Seizures    GI/Hepatic Neg liver ROS,GERD  ,,  Endo/Other  negative endocrine ROS    Renal/GU negative Renal ROS     Musculoskeletal   Abdominal   Peds  Hematology negative hematology ROS (+)   Anesthesia Other Findings   Reproductive/Obstetrics                              Anesthesia Physical Anesthesia Plan  ASA: 2  Anesthesia Plan: General   Post-op Pain Management: Minimal or no pain anticipated   Induction: Intravenous  PONV Risk Score and Plan: 2 and Treatment may vary due to age or medical condition, Midazolam, Ondansetron  and Dexamethasone  Airway Management Planned: Oral ETT  Additional Equipment: None  Intra-op Plan:   Post-operative Plan: Extubation in OR  Informed Consent: I have reviewed the patients History and Physical, chart, labs and discussed the procedure including the risks, benefits and alternatives for the proposed anesthesia with the patient or authorized representative who has indicated his/her understanding and acceptance.     Dental advisory given  Plan  Discussed with: CRNA, Surgeon and Anesthesiologist  Anesthesia Plan Comments: (Risks of general anesthesia discussed including, but not limited to, sore throat, hoarse voice, chipped/damaged teeth, injury to vocal cords, nausea and vomiting, allergic reactions, lung infection, heart attack, stroke, and death. All questions answered. )         Anesthesia Quick Evaluation

## 2024-08-27 ENCOUNTER — Ambulatory Visit (HOSPITAL_COMMUNITY): Admitting: Anesthesiology

## 2024-08-27 ENCOUNTER — Ambulatory Visit (HOSPITAL_COMMUNITY)
Admission: RE | Admit: 2024-08-27 | Discharge: 2024-08-27 | Disposition: A | Discharge status: Home / Self Care | Attending: Cardiovascular Disease | Admitting: Cardiovascular Disease

## 2024-08-27 ENCOUNTER — Ambulatory Visit (HOSPITAL_COMMUNITY)
Admission: RE | Disposition: A | Discharge status: Home / Self Care | Payer: Self-pay | Source: Home / Self Care | Attending: Cardiovascular Disease

## 2024-08-27 ENCOUNTER — Other Ambulatory Visit: Payer: Self-pay

## 2024-08-27 DIAGNOSIS — Z9884 Bariatric surgery status: Secondary | ICD-10-CM | POA: Insufficient documentation

## 2024-08-27 DIAGNOSIS — Z7901 Long term (current) use of anticoagulants: Secondary | ICD-10-CM | POA: Insufficient documentation

## 2024-08-27 DIAGNOSIS — I4819 Other persistent atrial fibrillation: Secondary | ICD-10-CM

## 2024-08-27 DIAGNOSIS — D6869 Other thrombophilia: Secondary | ICD-10-CM | POA: Diagnosis not present

## 2024-08-27 DIAGNOSIS — Z6825 Body mass index (BMI) 25.0-25.9, adult: Secondary | ICD-10-CM | POA: Diagnosis not present

## 2024-08-27 DIAGNOSIS — F17200 Nicotine dependence, unspecified, uncomplicated: Secondary | ICD-10-CM | POA: Diagnosis not present

## 2024-08-27 DIAGNOSIS — G4730 Sleep apnea, unspecified: Secondary | ICD-10-CM | POA: Diagnosis not present

## 2024-08-27 DIAGNOSIS — E669 Obesity, unspecified: Secondary | ICD-10-CM | POA: Diagnosis not present

## 2024-08-27 HISTORY — PX: ATRIAL FIBRILLATION ABLATION: EP1191

## 2024-08-27 LAB — POCT ACTIVATED CLOTTING TIME
Activated Clotting Time: 279 s
Activated Clotting Time: 314 s
Activated Clotting Time: 354 s
Activated Clotting Time: 343 s

## 2024-08-27 SURGERY — ATRIAL FIBRILLATION ABLATION
Anesthesia: General

## 2024-08-27 MED ORDER — ONDANSETRON HCL 4 MG/2ML IJ SOLN
INTRAMUSCULAR | Status: DC | PRN
  Administered 2024-08-27: 4 mg via INTRAVENOUS

## 2024-08-27 MED ORDER — SODIUM CHLORIDE 0.9 % IV SOLN: INTRAVENOUS | Status: DC

## 2024-08-27 MED ORDER — ROCURONIUM BROMIDE 10 MG/ML (PF) SYRINGE
PREFILLED_SYRINGE | INTRAVENOUS | Status: DC | PRN
  Administered 2024-08-27: 20 mg via INTRAVENOUS
  Administered 2024-08-27: 60 mg via INTRAVENOUS
  Administered 2024-08-27: 20 mg via INTRAVENOUS

## 2024-08-27 MED ORDER — MIDAZOLAM HCL 2 MG/2ML IJ SOLN
INTRAMUSCULAR | Status: AC
  Filled 2024-08-27: qty 2

## 2024-08-27 MED ORDER — HEPARIN SODIUM (PORCINE) 1000 UNIT/ML IJ SOLN
INTRAMUSCULAR | Status: AC
  Filled 2024-08-27: qty 10

## 2024-08-27 MED ORDER — SODIUM CHLORIDE 0.9% FLUSH: 3.0000 mL | INTRAVENOUS | Status: DC | PRN

## 2024-08-27 MED ORDER — SODIUM CHLORIDE 0.9 % IV SOLN
250.0000 mL | INTRAVENOUS | Status: DC | PRN
Start: 2024-08-27 — End: 2024-08-28

## 2024-08-27 MED ORDER — PHENYLEPHRINE 80 MCG/ML (10ML) SYRINGE FOR IV PUSH (FOR BLOOD PRESSURE SUPPORT)
PREFILLED_SYRINGE | INTRAVENOUS | Status: DC | PRN
  Administered 2024-08-27: 80 ug via INTRAVENOUS
  Administered 2024-08-27: 160 ug via INTRAVENOUS

## 2024-08-27 MED ORDER — MIDAZOLAM HCL (PF) 2 MG/2ML IJ SOLN
INTRAMUSCULAR | Status: DC | PRN
  Administered 2024-08-27: 2 mg via INTRAVENOUS

## 2024-08-27 MED ORDER — PROPOFOL 10 MG/ML IV BOLUS
INTRAVENOUS | Status: DC | PRN
  Administered 2024-08-27: 140 mg via INTRAVENOUS

## 2024-08-27 MED ORDER — HEPARIN (PORCINE) IN NACL 1000-0.9 UT/500ML-% IV SOLN
INTRAVENOUS | Status: DC | PRN
  Administered 2024-08-27 (×2): 500 mL

## 2024-08-27 MED ORDER — HEPARIN SODIUM (PORCINE) 1000 UNIT/ML IJ SOLN
INTRAMUSCULAR | Status: DC | PRN
  Administered 2024-08-27: 14000 [IU] via INTRAVENOUS
  Administered 2024-08-27: 4000 [IU] via INTRAVENOUS
  Administered 2024-08-27: 5000 [IU] via INTRAVENOUS

## 2024-08-27 MED ORDER — PROTAMINE SULFATE 10 MG/ML IV SOLN
INTRAVENOUS | Status: DC | PRN
  Administered 2024-08-27: 50 mg via INTRAVENOUS

## 2024-08-27 MED ORDER — SUGAMMADEX SODIUM 200 MG/2ML IV SOLN
INTRAVENOUS | Status: DC | PRN
  Administered 2024-08-27: 200 mg via INTRAVENOUS

## 2024-08-27 MED ORDER — FENTANYL CITRATE (PF) 100 MCG/2ML IJ SOLN
INTRAMUSCULAR | Status: AC
  Filled 2024-08-27: qty 2

## 2024-08-27 MED ORDER — ACETAMINOPHEN 325 MG PO TABS: 650.0000 mg | ORAL_TABLET | ORAL | Status: DC | PRN

## 2024-08-27 MED ORDER — ATROPINE SULFATE 1 MG/10ML IJ SOSY
PREFILLED_SYRINGE | INTRAMUSCULAR | Status: DC | PRN
  Administered 2024-08-27: 1 mg via INTRAVENOUS

## 2024-08-27 MED ORDER — HEPARIN SODIUM (PORCINE) 1000 UNIT/ML IJ SOLN
INTRAMUSCULAR | Status: AC
  Filled 2024-08-27: qty 20

## 2024-08-27 MED ORDER — LIDOCAINE 2% (20 MG/ML) 5 ML SYRINGE
INTRAMUSCULAR | Status: DC | PRN
  Administered 2024-08-27: 100 mg via INTRAVENOUS

## 2024-08-27 MED ORDER — ATROPINE SULFATE 1 MG/10ML IJ SOSY
PREFILLED_SYRINGE | INTRAMUSCULAR | Status: AC
  Filled 2024-08-27: qty 10

## 2024-08-27 MED ORDER — FENTANYL CITRATE (PF) 250 MCG/5ML IJ SOLN
INTRAMUSCULAR | Status: DC | PRN
  Administered 2024-08-27 (×2): 50 ug via INTRAVENOUS

## 2024-08-27 MED ORDER — ONDANSETRON HCL 4 MG/2ML IJ SOLN
4.0000 mg | Freq: Four times a day (QID) | INTRAMUSCULAR | Status: DC | PRN
Start: 2024-08-27 — End: 2024-08-28

## 2024-08-27 MED ORDER — HEPARIN SODIUM (PORCINE) 1000 UNIT/ML IJ SOLN
INTRAMUSCULAR | Status: DC | PRN
  Administered 2024-08-27: 1000 [IU] via INTRAVENOUS

## 2024-08-27 SURGICAL SUPPLY — 16 items
CABLE VARIPULSE STERILE (CATHETERS) IMPLANT
CATH GE 8FR SOUNDSTAR (CATHETERS) IMPLANT
CATH OCTARAY 2.0 F 3-3-3-3-3 (CATHETERS) IMPLANT
CATH WEB BI DIR CSDF CRV REPRO (CATHETERS) IMPLANT
CATHETER VARIPULSE 8.5FR (CATHETERS) IMPLANT
CLOSURE PERCLOSE PROSTYLE (Vascular Products) IMPLANT
COVER SWIFTLINK CONNECTOR (BAG) ×1 IMPLANT
DEVICE CLOSURE MYNXGRIP 6/7F (Vascular Products) IMPLANT
KIT VERSACROSS 8.5F 63 45D 180 (KITS) IMPLANT
PACK EP LF (CUSTOM PROCEDURE TRAY) ×1 IMPLANT
PAD DEFIB RADIO PHYSIO CONN (PAD) ×1 IMPLANT
PATCH CARTO3 (PAD) IMPLANT
SHEATH CARTO VIZIGO MED CURVE (SHEATH) IMPLANT
SHEATH PINNACLE 8F 10CM (SHEATH) IMPLANT
SHEATH PINNACLE 9F 10CM (SHEATH) IMPLANT
SHEATH PROBE COVER 6X72 (BAG) IMPLANT

## 2024-08-27 NOTE — Anesthesia Procedure Notes (Signed)
 Procedure Name: Intubation Date/Time: 08/27/2024 1:03 PM  Performed by: Marva Lonni PARAS, CRNAPre-anesthesia Checklist: Patient identified, Emergency Drugs available, Suction available and Patient being monitored Patient Re-evaluated:Patient Re-evaluated prior to induction Oxygen Delivery Method: Circle System Utilized Preoxygenation: Pre-oxygenation with 100% oxygen Induction Type: IV induction Ventilation: Mask ventilation without difficulty Laryngoscope Size: Mac and 3 Grade View: Grade I Tube type: Oral Tube size: 7.5 mm Number of attempts: 1 Airway Equipment and Method: Stylet and Oral airway Placement Confirmation: ETT inserted through vocal cords under direct vision, positive ETCO2 and breath sounds checked- equal and bilateral Secured at: 21 cm Tube secured with: Tape Dental Injury: Teeth and Oropharynx as per pre-operative assessment

## 2024-08-27 NOTE — Progress Notes (Signed)
 Discharge instructions reviewed with patient and spouse at bedside. Denies questions concerns. PT tolerated PO intake. Ambulated in the hallway, was able to void without difficulty. Seen by MD incision site remains clean dry and intact. No s/s of complications. PT escorted from the unit via wheel chair to personal vehicle.

## 2024-08-27 NOTE — Transfer of Care (Signed)
 Immediate Anesthesia Transfer of Care Note  Patient: Arthur Collins  Procedure(s) Performed: ATRIAL FIBRILLATION ABLATION  Patient Location: Cath Lab  Anesthesia Type:General  Level of Consciousness: drowsy and patient cooperative  Airway & Oxygen Therapy: Patient Spontanous Breathing  Post-op Assessment: Report given to RN and Post -op Vital signs reviewed and stable  Post vital signs: Reviewed and stable  Last Vitals:  Vitals Value Taken Time  BP    Temp    Pulse 83 08/27/24 15:26  Resp 14 08/27/24 15:26  SpO2 100 % 08/27/24 15:26  Vitals shown include unfiled device data.  Last Pain:  Vitals:   08/27/24 1028  TempSrc: Oral         Complications: No notable events documented.

## 2024-08-27 NOTE — Discharge Instructions (Signed)

## 2024-08-27 NOTE — Anesthesia Postprocedure Evaluation (Signed)
 Anesthesia Post Note  Patient: Arthur Collins  Procedure(s) Performed: ATRIAL FIBRILLATION ABLATION     Patient location during evaluation: Cath Lab Anesthesia Type: General Level of consciousness: awake and alert Pain management: pain level controlled Vital Signs Assessment: post-procedure vital signs reviewed and stable Respiratory status: spontaneous breathing, nonlabored ventilation, respiratory function stable and patient connected to nasal cannula oxygen Cardiovascular status: blood pressure returned to baseline and stable Postop Assessment: no apparent nausea or vomiting Anesthetic complications: no   No notable events documented.  Last Vitals:  Vitals:   08/27/24 1800 08/27/24 1900  BP: 111/71 (!) 110/55  Pulse: 60 72  Resp: 12 (!) 27  Temp:    SpO2: 99% 98%    Last Pain:  Vitals:   08/27/24 1900  TempSrc:   PainSc: 0-No pain   Pain Goal: Patients Stated Pain Goal: 0 (08/27/24 1632)                 [name] Gab

## 2024-08-28 ENCOUNTER — Encounter (HOSPITAL_COMMUNITY): Payer: Self-pay | Admitting: Cardiovascular Disease

## 2024-08-28 ENCOUNTER — Telehealth (HOSPITAL_COMMUNITY): Payer: Self-pay

## 2024-08-28 MED FILL — Fentanyl Citrate Preservative Free (PF) Inj 100 MCG/2ML: INTRAMUSCULAR | Qty: 2 | Status: AC

## 2024-08-28 MED FILL — Heparin Sodium (Porcine) Inj 1000 Unit/ML: INTRAMUSCULAR | Qty: 10 | Status: AC

## 2024-08-28 NOTE — Telephone Encounter (Signed)
 Spoke with patient to complete post procedure follow up call.  Patient reports no complications with groin sites.   Instructions reviewed with patient:  Remove large bandage at puncture site after 24 hours. It is normal to have bruising, tenderness, mild swelling, and a pea or marble sized lump/knot at the groin site which can take up to three months to resolve.  Get help right away if you notice sudden swelling at the puncture site.  Check your puncture site every day for signs of infection: fever, redness, swelling, pus drainage, warmth, foul odor or excessive pain. If this occurs, please call [phone], to speak with the RN Navigator. Get help right away if your puncture site is bleeding and the bleeding does not stop after applying firm pressure to the area.  You may continue to have skipped beats/ atrial fibrillation during the first several months after your procedure.  It is very important not to miss any doses of your blood thinner Xarelto .    You will follow up with the Afib clinic 4 weeks after your procedure and follow up with Dr. Dr. Nancey 3 months after your procedure.  Activity restrictions reviewed.  Patient verbalized understanding to all instructions provided.

## 2024-08-30 NOTE — Telephone Encounter (Signed)
 FMLA forms completed and placed in box to be faxed.

## 2024-09-02 NOTE — Telephone Encounter (Signed)
 All three forms were faxed to the insurances and scanned into chart. Billing notified.

## 2024-09-06 NOTE — H&P (Signed)
 Electrophysiology Office Note:    Date:  09/06/2024   ID:  [name], DOB 1991-01-12, MRN [id number]  PCP:  Marvine Rush, MD   [facility] Providers Cardiologist:  Diannah SHAUNNA Maywood, MD     Referring MD: No ref. provider found   History of Present Illness:    Arthur Collins is a 33 y.o. male with a medical history significant for atrial fibrillation sepsis DC cardioversion in August 2024 in 2025, referred for management of atrial fibrillation.     Discussed the use of AI scribe software for clinical note transcription with the patient, who gave verbal consent to proceed.  History of Present Illness Arthur Collins is a 33 year old male with atrial fibrillation who presents for evaluation of his condition.  He experiences daily episodes of atrial fibrillation with heart rates ranging from 40 to 180 beats per minute, as recorded by his Apple Watch. These episodes often make him feel like he is going to pass out. He has been on blood thinners for the past 30 days and is concerned about missing doses.  He finds it difficult to regulate his heart rate during exercise, which often triggers his atrial fibrillation. This causes him to feel faint, necessitating that he lie down. He is interested in maintaining physical fitness but struggles with these symptoms.         Today, he reports that he feels well and has no acute complaints.  EKGs/Labs/Other Studies Reviewed Today:     Echocardiogram:  TTE August 2024 LVEF 60 to 65%.  Normal RV systolic function.  Mitral valve normal in structure.  Aortic valve has an indeterminate number of cusps but no stenosis or regurgitation is visualized.   EKG:         Physical Exam:    VS:  BP (!) 110/55   Pulse 72   Temp (!) 97.4 F (36.3 C) (Axillary)   Resp (!) 27   Ht 5' 9 (1.753 m)   Wt 78.5 kg   SpO2 98%   BMI 25.55 kg/m     Wt Readings from Last 3 Encounters:  08/27/24 78.5 kg  07/15/24 82.1 kg   06/14/24 80.3 kg     GEN: Well nourished, well developed in no acute distress CARDIAC: RRR, no murmurs, rubs, gallops RESPIRATORY:  Normal work of breathing MUSCULOSKELETAL: no edema    ASSESSMENT & PLAN:     Persistent atrial fibrillation Symptomatic, multiple ER visits Multiple cardioversions We discussed management options, and using a shared decision approach, decided to treat with A-fib ablation scheduling. He will need a CT. Either the Carto system or the Opal system may be used  We discussed the indication, rationale, logistics, anticipated benefits, and potential risks of the ablation procedure including but not limited to -- bleed at the groin access site, chest pain, damage to nearby organs such as the diaphragm, lungs, or esophagus, need for a drainage tube, or prolonged hospitalization. I explained that the risk for stroke, heart attack, need for open chest surgery, or even death is very low but not zero. he  expressed understanding and wishes to proceed.   Secondary hypercoagulable state CHA2DS2-VASc score is 0   Obesity He has had a gastric bypass surgery I suspect his A-fib is due to history of morbid obesity and prior sleep apnea  Sleep apnea History of sleep apnea prior to significant weight lost Dr. Bettyjo has requested repeat sleep study   Signed, Eulas FORBES Furbish, MD  09/06/2024 9:03 AM    [facility]

## 2024-09-24 ENCOUNTER — Ambulatory Visit (HOSPITAL_COMMUNITY): Admission: RE | Admit: 2024-09-24 | Source: Ambulatory Visit | Admitting: Internal Medicine

## 2024-09-24 ENCOUNTER — Encounter (HOSPITAL_COMMUNITY): Payer: Self-pay

## 2024-09-24 NOTE — Progress Notes (Incomplete)
    Primary Care Physician: Arthur Rush, MD Primary Cardiologist: Arthur P Mallipeddi, MD Electrophysiologist: Arthur FORBES Furbish, MD  {Click to update primary MD,subspecialty MD or APP then REFRESH:1}   Referring Physician: Dr. Furbish  {Removed [facility], PMH, PSH, ALLERGY, CMED, and SOC :1}   Arthur Collins is a 33 y.o. male with a history of persistent atrial fibrillation who presents for consultation in the [facility]. Patient is on Xarelto  for stroke prevention.  On evaluation today, patient is currently in ***. S/p Afib ablation on 08/27/2024 by Arthur Collins. No episodes of Afib since ablation. No chest pain or SOB. Leg sites healed without issue. No missed doses of anticoagulant.  Today, he denies symptoms of orthopnea, PND, lower extremity edema, dizziness, presyncope, syncope, snoring, daytime somnolence, bleeding, or neurologic sequela. The patient is tolerating medications without difficulties and is otherwise without complaint today.    he has a BMI of There is no height or weight on file to calculate BMI.. There were no vitals filed for this visit.  Current Outpatient Medications  Medication Sig Dispense Refill   rivaroxaban  (XARELTO ) 20 MG TABS tablet Take 1 tablet (20 mg total) by mouth daily with supper. 30 tablet 5   No current facility-administered medications for this visit.    Atrial Fibrillation Management history:  Previous antiarrhythmic drugs: None Previous cardioversions: Multiple Previous ablations: 08/27/2024 Anticoagulation history: Xarelto    ROS- All systems are reviewed and negative except as per the HPI above.  Physical Exam: There were no vitals taken for this visit.  GEN: Well nourished, well developed in no acute distress NECK: No JVD; No carotid bruits CARDIAC: {EPRHYTHM:28826}, no murmurs, rubs, gallops RESPIRATORY:  Clear to auscultation without rales, wheezing or rhonchi  ABDOMEN: Soft, non-tender,  non-distended EXTREMITIES:  No edema; No deformity   EKG today demonstrates ***  Echo 06/15/2023 demonstrated  1. Left ventricular ejection fraction, by estimation, is 60 to 65%. The  left ventricle has normal function. The left ventricle has no regional  wall motion abnormalities. Left ventricular diastolic parameters were  normal.   2. Right ventricular systolic function is normal. The right ventricular  size is normal. Tricuspid regurgitation signal is inadequate for assessing  PA pressure.   3. The mitral valve is normal in structure. Trivial mitral valve  regurgitation. No evidence of mitral stenosis.   4. The aortic valve has an indeterminant number of cusps. Aortic valve  regurgitation is not visualized. No aortic stenosis is present.   5. The inferior vena cava is normal in size with greater than 50%  respiratory variability, suggesting right atrial pressure of 3 mmHg.    ASSESSMENT & PLAN CHA2DS2-VASc Score =    The patient's score is based upon:   {Click here to calculate score.  REFRESH note before signing. :1}     ASSESSMENT AND PLAN: {Select the correct AFib Diagnosis                 :[id number]} Persistent  Chads 0  S/p A-fib ablation on 08/27/2024 by Dr. Gustabo  Patient is currently in***.  Continue Xarelto  20 mg daily throughout the 90-day post ablation period.    Follow up with EP as scheduled.   Arthur Collins, [facility]  Afib Clinic [address] [place], [place] [address] [phone]

## 2024-10-02 ENCOUNTER — Other Ambulatory Visit: Payer: Self-pay

## 2024-10-02 ENCOUNTER — Emergency Department (HOSPITAL_COMMUNITY)

## 2024-10-02 ENCOUNTER — Emergency Department (HOSPITAL_COMMUNITY)
Admission: EM | Admit: 2024-10-02 | Discharge: 2024-10-02 | Disposition: A | Discharge status: Home / Self Care | Attending: Emergency Medicine | Admitting: Emergency Medicine

## 2024-10-02 DIAGNOSIS — Z7901 Long term (current) use of anticoagulants: Secondary | ICD-10-CM | POA: Insufficient documentation

## 2024-10-02 DIAGNOSIS — R042 Hemoptysis: Secondary | ICD-10-CM | POA: Diagnosis present

## 2024-10-02 DIAGNOSIS — I4891 Unspecified atrial fibrillation: Secondary | ICD-10-CM | POA: Diagnosis not present

## 2024-10-02 LAB — COMPREHENSIVE METABOLIC PANEL WITH GFR
ALT: 16 U/L (ref 0–44)
AST: 19 U/L (ref 15–41)
Albumin: 4.5 g/dL (ref 3.5–5.0)
Alkaline Phosphatase: 107 U/L (ref 38–126)
Anion gap: 8 (ref 5–15)
BUN: 11 mg/dL (ref 6–20)
CO2: 27 mmol/L (ref 22–32)
Calcium: 9.7 mg/dL (ref 8.9–10.3)
Chloride: 106 mmol/L (ref 98–111)
Creatinine, Ser: 0.77 mg/dL (ref 0.61–1.24)
GFR, Estimated: >60 mL/min (ref >60)
Glucose, Bld: 83 mg/dL (ref 70–99)
Potassium: 4.6 mmol/L (ref 3.5–5.1)
Sodium: 141 mmol/L (ref 135–145)
Total Bilirubin: 0.4 mg/dL (ref 0.0–1.2)
Total Protein: 7.2 g/dL (ref 6.5–8.1)

## 2024-10-02 LAB — CBC WITH DIFFERENTIAL/PLATELET
Abs Immature Granulocytes: 0.01 K/uL (ref 0.00–0.07)
Basophils Absolute: 0 K/uL (ref 0.0–0.1)
Basophils Relative: 1 %
Eosinophils Absolute: 0.2 K/uL (ref 0.0–0.5)
Eosinophils Relative: 5 %
HCT: 42.5 % (ref 39.0–52.0)
Hemoglobin: 13.9 g/dL (ref 13.0–17.0)
Immature Granulocytes: 0 %
Lymphocytes Relative: 38 %
Lymphs Abs: 1.5 K/uL (ref 0.7–4.0)
MCH: 29 pg (ref 26.0–34.0)
MCHC: 32.7 g/dL (ref 30.0–36.0)
MCV: 88.5 fL (ref 80.0–100.0)
Monocytes Absolute: 0.3 K/uL (ref 0.1–1.0)
Monocytes Relative: 9 %
Neutro Abs: 1.8 K/uL (ref 1.7–7.7)
Neutrophils Relative %: 47 %
Platelets: 196 K/uL (ref 150–400)
RBC: 4.8 MIL/uL (ref 4.22–5.81)
RDW: 12.2 % (ref 11.5–15.5)
WBC: 3.9 K/uL — ABNORMAL LOW (ref 4.0–10.5)
nRBC: 0 % (ref 0.0–0.2)

## 2024-10-02 LAB — PROTIME-INR
INR: 1.3 — ABNORMAL HIGH (ref 0.8–1.2)
Prothrombin Time: 17.3 s — ABNORMAL HIGH (ref 11.4–15.2)

## 2024-10-02 NOTE — ED Triage Notes (Signed)
 Pt. Stated, I've been on blood thinners since July. I had an ablation last month. This morning I coughed and I cough up a blood clot.

## 2024-10-02 NOTE — ED Provider Triage Note (Signed)
 Emergency Medicine Provider Triage Evaluation Note  Arthur Collins , a 33 y.o. male  was evaluated in triage.  Pt complains of coughing up blood clot x 1 episode. Endorses lightheadedness when bending over. Does not check stools, uncertain if melena or hematochezia present. On Xarelto  for AF with only 1 missed dose last week, otherwise compliant.   Endorses alcohol use 4 days ago. Endorses smoking tobacco 3-4 cigarettes a day.   Denies fever, headaches, epistaxis, vision changes, cough, congestion, odynophagia, chest pain, shortness of breath, abdominal pain, n/v/d, dysuria, LE swelling.   Review of Systems  Positive: N/a Negative: N/a  Physical Exam  BP 128/89 (BP Location: Right Arm)   Pulse 61   Temp (!) 97.5 F (36.4 C) (Oral)   Resp 15   Ht 5' 9 (1.753 m)   Wt 76.7 kg   SpO2 100%   BMI 24.96 kg/m  Gen:   Awake, no distress   Resp:  Normal effort  MSK:   Moves extremities without difficulty  Other:    Medical Decision Making  Medically screening exam initiated at 10:29 AM.  Appropriate orders placed.  Toribio JONETTA Bars was informed that the remainder of the evaluation will be completed by another provider, this initial triage assessment does not replace that evaluation, and the importance of remaining in the ED until their evaluation is complete.     Beola Terrall RAMAN, [place] 10/02/24 1034

## 2024-10-02 NOTE — ED Provider Notes (Signed)
°  [facility] AT [facility] Provider Note   CSN: [id number] Arrival date & time: 10/02/24  9057     Patient presents with: coughed up blood clot   Arthur Collins is a 33 y.o. male.  {Add pertinent medical, surgical, social history, OB history to HPI:32947} HPI     Prior to Admission medications  Medication Sig Start Date End Date Taking? Authorizing Provider  rivaroxaban  (XARELTO ) 20 MG TABS tablet Take 1 tablet (20 mg total) by mouth daily with supper. 06/11/24   Mallipeddi, Diannah SQUIBB, MD    Allergies: Eliquis  [apixaban ] and Iohexol     Review of Systems  Updated Vital Signs BP 108/74 (BP Location: Left Arm)   Pulse 61   Temp 97.6 F (36.4 C)   Resp 18   Ht 5' 9 (1.753 m)   Wt 76.7 kg   SpO2 99%   BMI 24.96 kg/m   Physical Exam  (all labs ordered are listed, but only abnormal results are displayed) Labs Reviewed  CBC WITH DIFFERENTIAL/PLATELET - Abnormal; Notable for the following components:      Result Value   WBC 3.9 (*)    All other components within normal limits  PROTIME-INR - Abnormal; Notable for the following components:   Prothrombin Time 17.3 (*)    INR 1.3 (*)    All other components within normal limits  COMPREHENSIVE METABOLIC PANEL WITH GFR    EKG: None  Radiology: DG Chest 2 View Result Date: 10/02/2024 CLINICAL DATA:  Hemoptysis EXAM: CHEST - 2 VIEW COMPARISON:  May 19, 2024 FINDINGS: The heart size and mediastinal contours are within normal limits. Both lungs are clear. The visualized skeletal structures are unremarkable. IMPRESSION: No active cardiopulmonary disease. Electronically Signed   By: Lynwood Landy Raddle M.D.   On: 10/02/2024 11:25    {Document cardiac monitor, telemetry assessment procedure when appropriate:32947} Procedures   Medications Ordered in the ED - No data to display    {Click here for ABCD2, HEART and other calculators REFRESH Note before signing:1}                               Medical Decision Making  ***  {Document critical care time when appropriate  Document review of labs and clinical decision tools ie CHADS2VASC2, etc  Document your independent review of radiology images and any outside records  Document your discussion with family members, caretakers and with consultants  Document social determinants of health affecting pt's care  Document your decision making why or why not admission, treatments were needed:32947:::1}   Final diagnoses:  None    ED Discharge Orders     None

## 2024-10-02 NOTE — Discharge Instructions (Addendum)
 Imaging exam and lab work were reassuring today.  Please follow-up with your cardiologist at your earliest convenience.  Monitor for worsening or new concerning symptoms which would include excessive bleeding during bowel movements, excessive bleeding with coughing or vomiting, dizziness, chest pain, or shortness of breath.  If any of these or other concerning symptoms occur please return the ED for further evaluation.

## 2024-11-29 ENCOUNTER — Ambulatory Visit: Admitting: Cardiovascular Disease
